# Patient Record
Sex: Male | Born: 1935 | Race: White | Hispanic: No | Marital: Married | Smoking: Former smoker
Health system: Southern US, Community
[De-identification: ages and names within clinical notes are randomized; demographics above are authoritative.]

## PROBLEM LIST (undated history)

## (undated) DIAGNOSIS — K279 Peptic ulcer, site unspecified, unspecified as acute or chronic, without hemorrhage or perforation: Secondary | ICD-10-CM

## (undated) DIAGNOSIS — R0602 Shortness of breath: Secondary | ICD-10-CM

## (undated) DIAGNOSIS — M545 Low back pain, unspecified: Secondary | ICD-10-CM

## (undated) DIAGNOSIS — I10 Essential (primary) hypertension: Secondary | ICD-10-CM

## (undated) DIAGNOSIS — E785 Hyperlipidemia, unspecified: Secondary | ICD-10-CM

## (undated) DIAGNOSIS — N400 Enlarged prostate without lower urinary tract symptoms: Secondary | ICD-10-CM

## (undated) DIAGNOSIS — K922 Gastrointestinal hemorrhage, unspecified: Secondary | ICD-10-CM

## (undated) DIAGNOSIS — E039 Hypothyroidism, unspecified: Secondary | ICD-10-CM

## (undated) DIAGNOSIS — Z9289 Personal history of other medical treatment: Secondary | ICD-10-CM

## (undated) DIAGNOSIS — R0989 Other specified symptoms and signs involving the circulatory and respiratory systems: Secondary | ICD-10-CM

## (undated) DIAGNOSIS — M5136 Other intervertebral disc degeneration, lumbar region: Secondary | ICD-10-CM

## (undated) DIAGNOSIS — I639 Cerebral infarction, unspecified: Secondary | ICD-10-CM

## (undated) DIAGNOSIS — K219 Gastro-esophageal reflux disease without esophagitis: Secondary | ICD-10-CM

## (undated) DIAGNOSIS — G8929 Other chronic pain: Secondary | ICD-10-CM

## (undated) DIAGNOSIS — M51369 Other intervertebral disc degeneration, lumbar region without mention of lumbar back pain or lower extremity pain: Secondary | ICD-10-CM

## (undated) DIAGNOSIS — I2510 Atherosclerotic heart disease of native coronary artery without angina pectoris: Secondary | ICD-10-CM

## (undated) DIAGNOSIS — M1990 Unspecified osteoarthritis, unspecified site: Secondary | ICD-10-CM

## (undated) DIAGNOSIS — Z8719 Personal history of other diseases of the digestive system: Secondary | ICD-10-CM

## (undated) DIAGNOSIS — N184 Chronic kidney disease, stage 4 (severe): Secondary | ICD-10-CM

## (undated) DIAGNOSIS — I509 Heart failure, unspecified: Secondary | ICD-10-CM

## (undated) DIAGNOSIS — M109 Gout, unspecified: Secondary | ICD-10-CM

## (undated) DIAGNOSIS — G4700 Insomnia, unspecified: Secondary | ICD-10-CM

## (undated) HISTORY — DX: Gastro-esophageal reflux disease without esophagitis: K21.9

## (undated) HISTORY — PX: VAGOTOMY AND PYLOROPLASTY: SHX831

## (undated) HISTORY — DX: Hyperlipidemia, unspecified: E78.5

## (undated) HISTORY — DX: Peptic ulcer, site unspecified, unspecified as acute or chronic, without hemorrhage or perforation: K27.9

## (undated) HISTORY — DX: Other specified symptoms and signs involving the circulatory and respiratory systems: R09.89

## (undated) HISTORY — DX: Unspecified osteoarthritis, unspecified site: M19.90

## (undated) HISTORY — DX: Insomnia, unspecified: G47.00

## (undated) HISTORY — PX: OTHER SURGICAL HISTORY: SHX169

## (undated) HISTORY — PX: TOE SURGERY: SHX1073

## (undated) HISTORY — DX: Other intervertebral disc degeneration, lumbar region: M51.36

## (undated) HISTORY — DX: Benign prostatic hyperplasia without lower urinary tract symptoms: N40.0

## (undated) HISTORY — DX: Other intervertebral disc degeneration, lumbar region without mention of lumbar back pain or lower extremity pain: M51.369

## (undated) HISTORY — PX: CATARACT EXTRACTION W/ INTRAOCULAR LENS IMPLANT: SHX1309

## (undated) HISTORY — PX: DUPUYTREN CONTRACTURE RELEASE: SHX1478

## (undated) HISTORY — DX: Atherosclerotic heart disease of native coronary artery without angina pectoris: I25.10

## (undated) HISTORY — DX: Essential (primary) hypertension: I10

## (undated) HISTORY — PX: CORONARY ANGIOPLASTY WITH STENT PLACEMENT: SHX49

## (undated) HISTORY — DX: Heart failure, unspecified: I50.9

---

## 1998-07-24 ENCOUNTER — Ambulatory Visit (HOSPITAL_COMMUNITY): Admission: RE | Admit: 1998-07-24 | Discharge: 1998-07-25 | Payer: Self-pay | Admitting: Interventional Cardiology

## 1999-05-21 HISTORY — PX: BROW LIFT: SHX178

## 1999-10-12 ENCOUNTER — Encounter: Admission: RE | Admit: 1999-10-12 | Discharge: 1999-10-12 | Payer: Self-pay | Admitting: Orthopedic Surgery

## 1999-10-13 ENCOUNTER — Encounter (INDEPENDENT_AMBULATORY_CARE_PROVIDER_SITE_OTHER): Payer: Self-pay | Admitting: *Deleted

## 1999-10-13 ENCOUNTER — Ambulatory Visit (HOSPITAL_BASED_OUTPATIENT_CLINIC_OR_DEPARTMENT_OTHER): Admission: RE | Admit: 1999-10-13 | Discharge: 1999-10-13 | Payer: Self-pay | Admitting: Orthopedic Surgery

## 1999-10-14 ENCOUNTER — Encounter: Payer: Self-pay | Admitting: Internal Medicine

## 1999-10-14 ENCOUNTER — Encounter: Admission: RE | Admit: 1999-10-14 | Discharge: 1999-10-14 | Payer: Self-pay | Admitting: Internal Medicine

## 2000-04-17 ENCOUNTER — Ambulatory Visit (HOSPITAL_COMMUNITY): Admission: RE | Admit: 2000-04-17 | Discharge: 2000-04-17 | Payer: Self-pay | Admitting: Interventional Cardiology

## 2003-05-05 ENCOUNTER — Encounter: Admission: RE | Admit: 2003-05-05 | Discharge: 2003-05-05 | Payer: Self-pay | Admitting: Internal Medicine

## 2003-05-05 ENCOUNTER — Encounter: Payer: Self-pay | Admitting: Internal Medicine

## 2003-05-22 ENCOUNTER — Ambulatory Visit (HOSPITAL_COMMUNITY): Admission: RE | Admit: 2003-05-22 | Discharge: 2003-05-22 | Payer: Self-pay | Admitting: Gastroenterology

## 2004-02-10 ENCOUNTER — Inpatient Hospital Stay (HOSPITAL_COMMUNITY): Admission: AD | Admit: 2004-02-10 | Discharge: 2004-02-12 | Payer: Self-pay | Admitting: Internal Medicine

## 2004-02-11 ENCOUNTER — Encounter (INDEPENDENT_AMBULATORY_CARE_PROVIDER_SITE_OTHER): Payer: Self-pay | Admitting: *Deleted

## 2004-05-19 ENCOUNTER — Ambulatory Visit (HOSPITAL_COMMUNITY): Admission: RE | Admit: 2004-05-19 | Discharge: 2004-05-19 | Payer: Self-pay | Admitting: Interventional Cardiology

## 2004-09-17 ENCOUNTER — Ambulatory Visit (HOSPITAL_COMMUNITY): Admission: RE | Admit: 2004-09-17 | Discharge: 2004-09-17 | Payer: Self-pay | Admitting: Internal Medicine

## 2004-09-27 ENCOUNTER — Encounter: Admission: RE | Admit: 2004-09-27 | Discharge: 2004-09-27 | Payer: Self-pay | Admitting: Internal Medicine

## 2004-10-05 ENCOUNTER — Ambulatory Visit (HOSPITAL_COMMUNITY): Admission: RE | Admit: 2004-10-05 | Discharge: 2004-10-06 | Payer: Self-pay | Admitting: Neurological Surgery

## 2005-09-14 ENCOUNTER — Encounter: Admission: RE | Admit: 2005-09-14 | Discharge: 2005-09-14 | Payer: Self-pay | Admitting: Internal Medicine

## 2006-04-07 ENCOUNTER — Ambulatory Visit (HOSPITAL_COMMUNITY): Admission: RE | Admit: 2006-04-07 | Discharge: 2006-04-07 | Payer: Self-pay | Admitting: Podiatry

## 2006-04-07 ENCOUNTER — Encounter: Payer: Self-pay | Admitting: Vascular Surgery

## 2007-02-21 ENCOUNTER — Ambulatory Visit: Payer: Self-pay | Admitting: Vascular Surgery

## 2007-02-21 ENCOUNTER — Ambulatory Visit: Admission: RE | Admit: 2007-02-21 | Discharge: 2007-02-21 | Payer: Self-pay | Admitting: Internal Medicine

## 2007-04-10 ENCOUNTER — Encounter: Admission: RE | Admit: 2007-04-10 | Discharge: 2007-04-10 | Payer: Self-pay | Admitting: Orthopedic Surgery

## 2007-04-11 ENCOUNTER — Ambulatory Visit (HOSPITAL_BASED_OUTPATIENT_CLINIC_OR_DEPARTMENT_OTHER): Admission: RE | Admit: 2007-04-11 | Discharge: 2007-04-11 | Payer: Self-pay | Admitting: Orthopedic Surgery

## 2007-09-20 HISTORY — PX: OTHER SURGICAL HISTORY: SHX169

## 2008-02-20 ENCOUNTER — Encounter: Admission: RE | Admit: 2008-02-20 | Discharge: 2008-02-20 | Payer: Self-pay | Admitting: Orthopedic Surgery

## 2008-03-24 ENCOUNTER — Inpatient Hospital Stay (HOSPITAL_COMMUNITY): Admission: EM | Admit: 2008-03-24 | Discharge: 2008-03-26 | Payer: Self-pay | Admitting: Emergency Medicine

## 2008-03-30 ENCOUNTER — Emergency Department (HOSPITAL_COMMUNITY): Admission: EM | Admit: 2008-03-30 | Discharge: 2008-03-30 | Payer: Self-pay | Admitting: Emergency Medicine

## 2008-05-23 ENCOUNTER — Inpatient Hospital Stay (HOSPITAL_COMMUNITY): Admission: AD | Admit: 2008-05-23 | Discharge: 2008-05-26 | Payer: Self-pay | Admitting: Internal Medicine

## 2008-09-19 HISTORY — PX: OTHER SURGICAL HISTORY: SHX169

## 2009-03-15 ENCOUNTER — Emergency Department (HOSPITAL_BASED_OUTPATIENT_CLINIC_OR_DEPARTMENT_OTHER): Admission: EM | Admit: 2009-03-15 | Discharge: 2009-03-15 | Payer: Self-pay | Admitting: Emergency Medicine

## 2009-04-23 ENCOUNTER — Emergency Department (HOSPITAL_BASED_OUTPATIENT_CLINIC_OR_DEPARTMENT_OTHER): Admission: EM | Admit: 2009-04-23 | Discharge: 2009-04-23 | Payer: Self-pay | Admitting: Emergency Medicine

## 2009-04-23 ENCOUNTER — Ambulatory Visit: Payer: Self-pay | Admitting: Radiology

## 2009-06-05 ENCOUNTER — Emergency Department (HOSPITAL_BASED_OUTPATIENT_CLINIC_OR_DEPARTMENT_OTHER): Admission: EM | Admit: 2009-06-05 | Discharge: 2009-06-05 | Payer: Self-pay | Admitting: Emergency Medicine

## 2009-06-05 ENCOUNTER — Ambulatory Visit: Payer: Self-pay | Admitting: Diagnostic Radiology

## 2009-11-22 ENCOUNTER — Encounter: Admission: RE | Admit: 2009-11-22 | Discharge: 2009-11-22 | Payer: Self-pay | Admitting: Internal Medicine

## 2010-10-10 ENCOUNTER — Encounter: Payer: Self-pay | Admitting: Internal Medicine

## 2010-10-11 ENCOUNTER — Encounter: Payer: Self-pay | Admitting: Internal Medicine

## 2010-12-24 LAB — URINALYSIS, ROUTINE W REFLEX MICROSCOPIC
Bilirubin Urine: NEGATIVE
Glucose, UA: NEGATIVE mg/dL
Hgb urine dipstick: NEGATIVE
Ketones, ur: NEGATIVE mg/dL
Nitrite: NEGATIVE
Protein, ur: NEGATIVE mg/dL
Specific Gravity, Urine: 1.011 (ref 1.005–1.030)
Urobilinogen, UA: 0.2 mg/dL (ref 0.0–1.0)
pH: 5.5 (ref 5.0–8.0)
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
## 2011-02-01 NOTE — Discharge Summary (Signed)
NAMEDOMENIC, SCHOENBERGER NO.:  [id number]   MEDICAL RECORD NO.:  [id number]          PATIENT TYPE:  INP   LOCATION:  3738                         FACILITY:  [facility]   PHYSICIAN:  Lyn Records, M.D.   DATE OF BIRTH:  [birth year]   DATE OF ADMISSION:  03/24/2008  DATE OF DISCHARGE:  03/26/2008                               DISCHARGE SUMMARY   DISCHARGE DIAGNOSES:  1. Coronary artery disease, medical management.  2. Renal insufficiency.  3. Hypertension.  4. Peptic ulcer disease.  5. History of cerebrovascular accident.  6. Lumbar disk disease status post surgery.  7. Allergy to penicillin.  8. Long-term medication use.  9. Hyperlipidemia.   HOSPITAL COURSE:  [name] is an 77 year old male patient who was  admitted to [facility] on March 24, 2008, with chest pain on exertion  for approximately 48 hours prior to admission.  Lab studies showed that  he ruled out by cardiac isoenzymes.  Other labs showed BUN of 27,  creatinine of 1.8, hemoglobin 11.5, hematocrit 33.4, white count 6.2,  and platelets 118.   Ultimately, he required cardiac catheterization and this showed severe  stenosis of the first septal perforator.  This was felt to be the likely  source of angina.  The LAD, first diagonal, and circumflex lesions are  unchanged compared to 2005.  LV was normal.  Dr. Katrinka Blazing felt that medical  therapy would be appropriate measure in this case, and we increased his  Imdur and added Plavix.   DISCHARGE MEDICATIONS:  1. Imdur 60 mg 1-1/2 tablets daily.  2. Plavix 75 mg a day for 1 month.  3. Crestor 10 mg a day.  4. Atenolol 50 mg twice a day.  5. Nexium 40 mg a day, when needed.  6. Enteric-coated aspirin 325 mg a day.  7. He is on a prednisone taper, which he is to finish.   He is to remain on a low-sodium, heart-healthy diet.  Increase activity  slowly.  Clean cath site gently with soap and water.  No scrubbing.  Follow with Dr. Katrinka Blazing on April 02, 2008,  at 3:45 p.m.      Guy Franco, P.A.      Lyn Records, M.D.  Electronically Signed    LB/MEDQ  D:  03/26/2008  T:  03/26/2008  Job:  [id number]   cc:   Candyce Churn, M.D.  Lyn Records, M.D.

## 2011-02-01 NOTE — Op Note (Signed)
[name], [name]               ACCOUNT NO.:  [id number]   MEDICAL RECORD NO.:  [id number]          PATIENT TYPE:  INP   LOCATION:  2103                         FACILITY:  [facility]   PHYSICIAN:  Shirley Friar, MDDATE OF BIRTH:  1923/02/07   DATE OF PROCEDURE:  DATE OF DISCHARGE:                               OPERATIVE REPORT   INDICATIONS:  1. Melena.  2. History of peptic ulcer disease.   MEDICATIONS:  1. Fentanyl 50 mcg IV.  2. Versed 7 mg IV.   FINDINGS:  Endoscope was inserted through oropharynx and esophagus was  intubated, which was normal in its entirety.  Endoscope was passed down  to the stomach, where a large amount of bright red blood clots were seen  in the fundus of the stomach.  There was a long segment of fresh blood  extruding from the distal part of the stomach.  Upon further irrigation,  it was evident that there was a complicated ulcer in the distal stomach.  Immediately prior to entering into the duodenum, there was a visible  vessel and adherent clot over this in the midportion of this ulcer, but  there was active bright red blood extruding from this area of the ulcer.  Epinephrine 2 mL were injected into the base of the ulcer with good  blanching, but continued oozing of blood.  BICAP Gold probe cautery was  then used to achieve hemostasis, which was unsuccessful.  Decision was  made then to try a hemoclip to attach to the site of bleeding and 1  hemoclip was applied with significant hemostasis with only a small  circle of blood still oozing.  After repeated irrigation of this area, a  decision was made to terminate the procedure due to some hypoxia in the  high 80s during the procedure.  The bleeding was stopped with the  hemoclip and epinephrine injection.   ASSESSMENT:  1. Complicated distal stomach ulcer with visible vessel and active      bleeding.  2. Status post epinephrine injection and hemoclip placement.   PLAN:  1. Transfer to ICU  bed.  2. Continuous IV Protonix infusion.  3. Due to the patient's renal insufficiency, embolization is not an      option and if the patient's bleeding recurs, then he will need      surgery.  4. n.p.o.  5. Aggressive volume resuscitation with blood products and IV fluids.      Shirley Friar, MD  Electronically Signed    VCS/MEDQ  D:  05/23/2008  T:  05/24/2008  Job:  [id number]
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
## 2011-02-01 NOTE — Op Note (Signed)
[name], [name]               ACCOUNT NO.:  [id number]   MEDICAL RECORD NO.:  [id number]          PATIENT TYPE:  AMB   LOCATION:  DSC                          FACILITY:  [facility]   PHYSICIAN:  Leonides Grills, M.D.     DATE OF BIRTH:  [birth year]   DATE OF PROCEDURE:  DATE OF DISCHARGE:                               OPERATIVE REPORT   PREOPERATIVE DIAGNOSIS:  Left hallux rigidus.   POSTOPERATIVE DIAGNOSIS:  Left hallux rigidus.   OPERATION:  Left great toe cheilectomy.   ANESTHESIA:  General.   SURGEON:  Leonides Grills, M.D.   ASSISTANT:  Evlyn Kanner, P.A.   ESTIMATED BLOOD LOSS:  Minimal.   TOURNIQUET TIME:  Approximately half hour.   COMPLICATIONS:  None.   DISPOSITION:  Stable to PR.   INDICATIONS:  This 77 year old male who has had longstanding dorsal left  great toe pain that is interfering with his life point that he can not  do what he wants to do second to arthritis.  He also has a known history  of gouty arthropathy as well.  He was consented to the above procedure.  All risks including infection, nerve vessel injury, persistent pain,  worse pain, prolonged recovery, stiffness, arthritis, possibility of  fusion versus arthroplasty future all explained.  Questions were  encouraged answered.  The patient brought to the operating placed in  supine position after adequate general endotracheal tube anesthesia  administered as well as Ancef 1 gram IV piggyback, left lower extremity  is prepped and draped in sterile manner over proximally placed thigh  tourniquet.  Limb was gravity exsanguinated.  Tourniquet elevated to 290  mmHg.  A longitudinal incision just medial to the extensor hallucis  longus was then made.  Dissection was carried down through skin.  Hemostasis was obtained.  Extensor hallucis longus was protected in its  sheath was not violated.  A longitudinal capsulotomy was then made.  Soft tissue was elevated and retracted both medial laterally.  Bur was  then removed with a sagittal saw followed by a rongeur.  Area was  copiously irrigated normal saline.  Bone wax applied to all exposed bony  surfaces capsule was closed with 3-0  Vicryl stitch.  Subcu was closed 3-0 Vicryl stitch.  Skin was closed 4-0  nylon stitch.  Sterile dressing was applied.  Hard sole shoe was  applied.  The patient was stable to PR.  Range of motion of toe after  the cheilectomy was excellent and much improved.      Leonides Grills, M.D.  Electronically Signed     PB/MEDQ  D:  04/11/2007  T:  04/12/2007  Job:  [id number]
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
## 2011-02-04 NOTE — Op Note (Signed)
[name], [name]               ACCOUNT NO.:  [id number]   MEDICAL RECORD NO.:  [id number]          PATIENT TYPE:  OIB   LOCATION:  NA                           FACILITY:  [facility]   PHYSICIAN:  Tia Alert, MD     DATE OF BIRTH:  November 13, 1922   DATE OF PROCEDURE:  10/05/2004  DATE OF DISCHARGE:                                 OPERATIVE REPORT   PREOPERATIVE DIAGNOSIS:  Lumbar spondylosis with lateral recess stenosis L4-  5 on the left and lumbar spinal stenosis with disk herniation L3-4 on the  left.   PROCEDURES:  1.  Decompressive hemilaminectomy, medial facetectomy and foraminotomy L4-5      on the left for lateral recess stenosis.  2.  Lumbar hemilaminectomy, medial facetectomy and foraminotomy L3-4 on the      left with diskectomy L3-4 on the left for lateral recess stenosis and      lumbar disk herniation.  3.  Microdissection.   SURGEON:  Tia Alert, M.D.   ASSISTANT:  Reinaldo Meeker, M.D.   ANESTHESIA:  General endotracheal anesthesia.   COMPLICATIONS:  None apparent.   INDICATIONS FOR PROCEDURE:  Mr. Rueb is a 77 year old white male who  presented to the neurosurgical unit with complaints of severe left leg pain  in both an L4 and an L5 distribution.  He felt he had progressive weakness  in his lower extremity and got to the point that he had difficulty with  ambulation.  MRI showed a very large disk herniation at L3-4 on the left  causing severe spinal stenosis at this level and at L4-5.  He had  significant lateral recess stenosis from overgrown yellow ligament, facet  hypertrophy, and disk bulge to the left.  Recommended a lumbar decompressive  laminectomy at both levels with diskectomy at L3-4.  He understood and the  risks and benefits and expected outcome and wished to proceed.   DESCRIPTION OF PROCEDURE:  The patient was taken to the operating room and  after induction of adequate general endotracheal anesthesia, he was rolled  into the prone  position with a Wilson frame and all pressure points were  padded.  His lumbar region was prepped with DuraPrep and then draped in the  usual sterile fashion.  There were 8 mL of local anesthesia injected.  A  dorsal midline incision was made and carried down to the lumbosacral fascia.  The fascia was opened and the paraspinous musculature was taken down in a  subperiosteal fashion to expose the L3-4 and L4-5 interspaces.  Intraoperative x-ray confirmed our level and then use of Kerrison punches  and a high-speed air-powered Black Max drill was used to perform a  hemilaminectomy, medial facetectomy and foraminotomy at L4-5 on the left  side.  The underlying yellow ligament was removed, exposing the underlying  dura and L5 nerve root.  Once the decompression was complete, I inspected  the disk space and found a calcified disk bulge.  This seemed to be  noncompressive.  Therefore I lined this with Gelfoam and went to the L3-4  level and again  utilizing the drill and the Kerrison punches, performed a  hemilaminectomy, medial facetectomy and foraminotomy at L3-4 on the left  side.  The underlying yellow ligament was opened and removed, exposing the  underlying dura and L4 nerve root.  Once I returned to the nerve root  medially, there was a large free fragment in the foramen at L3-4 on the  right side adjacent to the pedicle.  This was removed and nerve hooks and  operating microscope was brought into the field and the remainder of the  procedure was done under the operating microscope utilizing microscopic  dissection to perform a thorough intradiskal diskectomy at L3-4 on the left  side.  Once the diskectomy was complete, we palpated with nerve hooks and  coronary dilators to make sure there were no more free fragments or  compressive lesions.  We then irrigated with copious amounts of Bacitracin  containing saline solution, inspected the nerve roots once again and then  closed the fascia  with interrupted #1 Vicryl, closed the subcutaneous and  subcuticular tissue with 2-0 and 3-0 Vicryl and closed the skin with  Dermabond.  The drapes were removed.  The patient was awakened from general  anesthesia and transported to the recovery room in stable condition.  At the  end of the procedure, all sponge, needle and instrument counts were correct.      Markus.Osmond   DSJ/MEDQ  D:  10/05/2004  T:  10/05/2004  Job:  [id number]

## 2011-02-04 NOTE — Cardiovascular Report (Signed)
[facility]  Patient:    [name]                       MRN: [id number] Proc. Date: 04/17/00 Adm. Date:  [id number] Disc. Date: [id number] Attending:  Lyn Records. Iii CC:         Pearla Dubonnet, M.D.                        Cardiac Catheterization  PROCEDURES PERFORMED: 1. Left heart catheterization. 2. Selective coronary angiography. 3. Left ventriculography. 4. Perclose arteriotomy closure.  INDICATIONS FOR PROCEDURE:  Recurrent angina in this 77 year old patient with a history of coronary artery disease, status post prior right coronary stenting in January 1999 and November 1999.  DESCRIPTION OF PROCEDURE:  After an informed consent, the patient was brought to the catheterization lab and a sheath was placed in the right femoral artery using the modified Seldinger technique.  A 6-French A2 multipurpose catheter was used for hemodynamic recordings, left ventriculography, and selective right and left coronary angiography.   The patient tolerated the procedure without complications.  We performed Perclose arteriotomy closure after a right femoral sheathogram demonstrated normal perfusion and anatomical substrate for this procedure.  No complications occurred.  RESULTS:  HEMODYNAMIC DATA: 1. Aortic pressure:  139/61. 2. Left ventricular pressure:  136/17 mmHg.  LEFT VENTRICULOGRAPHY:  The left ventricle is normal in size and demonstrates normal contractility.  The ejection fraction is 65%.  No mitral regurgitation is noted.  SELECTIVE CORONARY ARTERIOGRAPHY: 1. LEFT MAIN:  Widely patent.  No significant obstructive lesions are noted. 2. LEFT ANTERIOR DESCENDING CORONARY:  Diffusely calcified.  There is    eccentric narrowing in the mid LAD beyond the second diagonal.  Obstruction    in this region is 35-45%.  No high-grade obstruction is noted.  The second    diagonal contains a segmental 75% stenosis.  This is a large branch.   The    first diagonal is free of any significant obstruction.  The LAD wraps    around the left ventricular apex. 3. CIRCUMFLEX ARTERY:  Relatively small vessel, giving origin to two obtuse    marginal branches.  There is 50% narrowing prior to bifurcation into these    two branches.  This occurs in the mid vessel.  No high-grade obstruction is    felt to be present. 4. RIGHT CORONARY ARTERY:  A large vessel that has a shepherds crook, and has    four implanted stents starting proximally and extending to the mid vessel.    This vessel is widely patent.  No evidence of significant obstruction is    noted within the right coronary artery.  CONCLUSIONS: 1. The patient has moderate coronary artery disease, with a 75% stenosis in    the first diagonal branch.  There is also disease in the mid LAD in the    region where the second diagonal branch arises from it.  The lesion in the    LAD is eccentric, but in almost every view appears to be less than 50%    narrowed.  The circumflex artery contains a 30-40% stenosis prior to    bifurcation in the two large obtuse marginal branches. 2. Wide patency of the previously stented right coronary artery. 3. Normal left ventricular function.  PLAN:  Medical therapy.  At this time the assumption is that the patients angina is due to the  diagonal.  Risk factor modification and medical therapy, including beta blocker therapy and nitrates should be considered.  If the patient develops refractory symptoms related to this anatomical substrate, we may consider doing cutting balloon angioplasty on the diagonal; or, potentially even rotational atherectomy on the diagonal in attempt to achieve better patency. DD:  04/17/00 TD:  04/17/00 Job: [id number]

## 2011-02-04 NOTE — Discharge Summary (Signed)
[name], SCHOENFELD NO.:  [id number]   MEDICAL RECORD NO.:  [id number]                   PATIENT TYPE:  INP   LOCATION:  3030                                 FACILITY:  [facility]   PHYSICIAN:  Candyce Churn, M.D.          DATE OF BIRTH:  1923/07/21   DATE OF ADMISSION:  02/10/2004  DATE OF DISCHARGE:  02/12/2004                                 DISCHARGE SUMMARY   DISCHARGE DIAGNOSES:  1. Acute right cerebellar infarct with apparent occlusion of the right     posterior inferior cerebellar artery.  2. History of coronary artery disease, stent placement in the past.  3. Hyperlipidemia.  4. Elevated homocysteine levels.  5. Mild hyperglycemia with normal hemoglobin A1c.  6. Hypertension, controlled.  7. Lumbar degenerative joint disease.  8. Mild renal insufficiency with creatinine 1.8.  9. History of prostatosis and prostatitis.  10.      History of esophageal stricture with Schatzki's ring.  11.      History of hypercholesterolemia.   DISCHARGE MEDICATIONS:  1. Plavix 75 mg daily.  2. Aspirin 81 mg daily.  3. Lipitor 10 mg daily.  4. Prilosec 20 mg daily.  5. Atenolol 25 mg daily.  6. Folic acid 1 g daily.  7. Multivitamin with B complex.   PROCEDURE:  MRI/MRA of the brain:  Revealed acute right cerebellar infarct  with probable occlusion of the right posterior/inferior cerebellar artery.  There are a few old small vessel strokes in the left hemisphere. No large  vessel stroke is seen supratentorially. The infarcted area of the inferior  cerebellum is swollen, and there is some mild mass effect on the brainstem  and fourth ventricle, but there was no obstructive hydrocephalus.   CONSULTATIONS:  Neurology. Dr. Porfirio Mylar Dohmeier.   HOSPITAL COURSE:  [name] is a very pleasant 77 year old male who  presented on Feb 09, 2004 to [facility] with vertigo. He  also had some associated nausea. No obvious focal  neurologic deficits were  noted at the time.   The patient was begun on Plavix, and MRI/MRA was ordered. His condition did  not improve over the ensuring 12 hours, and he was admitted to [facility] on Feb 10, 2004. MRI/MRA revealed the above, and Plavix was  continued as well as physical therapy and occupational therapy was  instituted.   The patient did obtain carotid Dopplers while hospitalized, and these showed  no significant stenosis, just some internal carotid artery plaque  bilaterally. Two-D echocardiogram was performed on Feb 11, 2004, and this  revealed ejection fraction of 55 to 65%, and there were no left ventricular  wall motion abnormalities. There was no diagnostic echocardiographic  evidence of cardiac source of embolism. Read by Dr. Candace Cruise.   The patient initially was extremely ataxia on admission, but within 48  hours, his ataxia was minimal. He was able to ambulate about his  room  without assistance but still slightly unsteady on his feet.   The patient was wishing to return home and will be discharged home with home  nursing visit and PT/OT. He should use a cane to walk for now.   I should mention that he did have elevated homocysteine level and will need  folic acid therapy at home.   At the time of discharge, his abdominal pain and nausea had markedly  improved and will go with a no concentrated sweets, low fat diet, that is  bland for the first several days. Will also allow him to have Phenergan 12.5  mg orally every 6 to 8 hours as needed for nausea.   Renal insufficiency was noted prior to admission with creatinine of 2.3 and  has improved. Creatinines while hospitalized were 1.1 to 1.8 range.   The patient was discharged in improved condition, and I will him up in the  office in 10 days.                                                Candyce Churn, M.D.    RNG/MEDQ  D:  02/12/2004  T:  02/13/2004  Job:  [id number]

## 2011-02-04 NOTE — Cardiovascular Report (Signed)
NAMEGURNIE, [name] NO.:  [id number]   MEDICAL RECORD NO.:  [id number]                   PATIENT TYPE:  OIB   LOCATION:  2899                                 FACILITY:  [facility]   PHYSICIAN:  Lesleigh Noe, M.D.            DATE OF BIRTH:  [birth year]   DATE OF PROCEDURE:  05/19/2004  DATE OF DISCHARGE:  05/19/2004                              CARDIAC CATHETERIZATION   REASON FOR CATHETERIZATION:  History of coronary artery disease, status post  right coronary stents, recent increased angina.   PROCEDURES PERFORMED:  1.  Left heart catheterization.  2.  Selective coronary angiography.  3.  Left ventriculography.  4.  Angiocele arteriotomy closure.   SURGEON:   DESCRIPTION OF PROCEDURE:  After informed consent, a 6 French sheath was  placed using a modified Seldinger technique.  A 6 French A2 multipurpose  catheter was used for hemodynamic recordings and left ventriculography.  We  then performed left and right coronary angiography using a #4 left and right  Judkins catheter, respectively.  A right sheathogram was performed post  procedure and arteriotomy closure with angiocele performed with good  hemostasis.   RESULTS:  1.  Hemodynamic Data:      1.  Aortic pressure 163/63.      2.  Left ventricular pressure 163/18./   1.  Left ventriculography:  LV cavity size and function are normal.  EF is      60%.   1.  Coronary angiography:      1.  Left main coronary artery:  Calcified but widely patent.      2.  Left anterior descending coronary artery:  LAD contained 60 to 70%          proximal narrowing.  Septal perforator #1 is 90 to 95% obstructed.          Diagonal #2, which was a large vessel, was 95% obstructed.  The LAD          in this region between the first septal perforator and the second          diagonal is 60 to 70% and calcified.  LAD reaches left ventricular          apex.      3.  Circumflex coronary artery:  Circumflex gives  origin to a large          first obtuse marginal and there is proximal/mid 70% narrowing.      4.  Right coronary artery:  The right coronary artery is a large vessel          given PDA and several large left ventricular branches.  Previously          placed proximal and mid vessel stents were widely patent.  No          significant obstruction is noted in the RCA.   CONCLUSION:  1.  Widely patent  right coronary with no evidence of in-stent restenosis in      the right coronary artery.  There is moderate proximal/mid left anterior      descending disease and high grade obstruction in the second diagonal and      the first septal perforator.  These are the likely sources of the      patient's anginal symptoms.  There is moderate disease in the mid      circumflex.  The circumflex appears unchanged from the prior study.  2.  Normal left ventricular function.   PLAN:  Medical therapy.  IV hydration for five hours this evening.  BUN and  creatinine will be obtained in 48 hours in our office.  When unable to  control the patient's symptoms with medication, we may need to consider  coronary artery bypass grafting. Percutaneous intervention on the septal  perforator and diagonal did not seem to be good treatment options.  The  diagonal could be treated with rotational atherectomy until the perforator  could not be treated by any technique.                                               Lesleigh Noe, M.D.    HWS/MEDQ  D:  05/19/2004  T:  05/20/2004  Job:  [id number]   cc:   Theressa Millard, M.D.  [address] [place]  [place] [address]  Fax: [phone]   Candyce Churn, M.D.  [address] [place]  [place] [address]  Fax: [phone]

## 2011-02-04 NOTE — Consult Note (Signed)
[name], [name]                         ACCOUNT NO.:  [id number]   MEDICAL RECORD NO.:  [id number]                   PATIENT TYPE:  INP   LOCATION:  3030                                 FACILITY:  [facility]   PHYSICIAN:  Melvyn Novas, M.D.               DATE OF BIRTH:  [birth year]   DATE OF CONSULTATION:  02/10/2004  DATE OF DISCHARGE:                                   CONSULTATION   This gentleman was admitted today on 02/10/2004 through Dr. Kevan Ny offices  arrangements.  The patient is a 77 year old gentleman with a long history of  concomitant internal medicine problems.  These include coronary artery  disease, GERD, peptic ulcer disease, and diverticulosis, degenerative joint  disease especially spinal, nocturnal leg cramps question of restless leg  syndrome, ruptured biceps tendon, renal insufficiency with elevated  creatinine and decreased creatinine clearance, prostatitis, dyslipidemia,  sciatica on the right hip, TMJ.  The patient has coronary artery disease and  had multiple RCA stents placed all in the same artery.  Laparotomy for  peptic ulcer disease in the past and arthroscopic surgery of the right knee  by Dr. ___________.   CURRENT MEDICATIONS:  Atenolol, Pepcid, baby aspirin, naproxen, recently  Plavix was added to his regimen, he believes about three or four months ago.   The patient was admitted today after a 48 hour complaint of ataxia falling  to the right and vertigo spells that the patient describes as very violent.  He had some emesis with this but denies headaches and has no neck stiffness,  no ear pain and no peripheral sensory loss.   PHYSICAL EXAMINATION:  VITAL SIGNS: The patient was referred through the ER  for admission and presents here with a weight of 165 pounds, normal  temperature, a blood pressure of 130/75, pulse rate is regular but slightly  elevated at 88.  HEENT: Mucous membranes appear well perfused.  NECK: No lumps.  No goiter.  EXTREMITIES: Peripheral pulses area palpable.  MENTAL STATUS: The patient is alert and oriented, very pleasant.  He can  participate fluently in the conversations and gave details of his medical  history, medicine regimen and his beekeeping hobby.  CRANIAL NERVES: Left-sided cataract, mild.  Pupils are equal to light and  accommodation.  Extraocular movements are full.  There is end point  established with gaze to the right.  There is no vertical nystagmus noted.  No facial droop.  Tongue and uvula are midline.  Gag is intact.  Range of  motion over the neck is normal.  MOTOR EXAM: Shows equal grip strength, tone and mass of upper extremities.  On passive movement there seems to be a slight increase in tone on the right  initially, but no pronator drift.  On the finger-to-nose test, mild limb  ataxia on the right is noticed.  Further noticed is that the right leg  cannot be elevated as long as  the left against gravity at about 8 seconds  versus 12 but the patient shows only a drift, not a drop in leg strength and  adduction and abduction appear intact.  Again, there is this time a decrease  in tone in the right lower extremity.  Downgoing toes to plantar  stimulation, equal reflexes but are attenuated in the lower extremities.  SENSORY: Unimpaired to pin prick, touch, vibration and temperature.  Coordination, the patient drifts to the right and tends to fall forward.  Interestingly, no tremor is noticed and his speech is unchanged.   MRI: A very slight cerebellar inferior stroke in the right cerebellum,  explaining the patient's vertiginous spells his more truncal ataxia problem  with intact strength.  A diagnosis of ischemic stroke, embolic or thrombotic  in nature, unclear at this point.   PLAN:  Admit the patient and have the stroke service follow him.  He will  have a 2D echocardiogram, carotid Doppler studies, homocystine, sed rate and  fasting lipid profile tomorrow.  He also will  be evaluated for PT/OT and  rehab.  Since being placed on Plavix did not prevent this stroke, I suggest  that we should evaluate him for either Aggrenox or Coumadin if we have  strong evidence of an embolic event.  The patient understands and agrees  with this regimen.   Sincerely,                                               Melvyn Novas, M.D.    CD/MEDQ  D:  02/10/2004  T:  02/12/2004  Job:  [id number]   cc:   Candyce Churn, M.D.  [address] [place]  [place] [address]  Fax: [phone]   Pramod P. Pearlean Brownie, MD  Fax: [phone]

## 2011-02-04 NOTE — Op Note (Signed)
   NAMEZACHAREY, [name]                         ACCOUNT NO.:  [id number]   MEDICAL RECORD NO.:  [id number]                   PATIENT TYPE:  AMB   LOCATION:  ENDO                                 FACILITY:  [facility]   PHYSICIAN:  Danise Edge, M.D.                DATE OF BIRTH:  07-22-1923   DATE OF PROCEDURE:  DATE OF DISCHARGE:                                 OPERATIVE REPORT   PROCEDURE:  Esophagogastroduodenoscopy with Savary esophageal dilation.   INDICATIONS FOR PROCEDURE:  Mr. Gilmar Bua is a 77 year old male born  [birth year].  Mr. Tetrault has a history of peptic ulcer disease and  underwent a vagotomy with pyloroplasty years ago.  He underwent a barium  swallow, and the 13-mm barium tablet hung at the esophagogastric junction.   ENDOSCOPIST:  Danise Edge, M.D.   PREMEDICATION:  Demerol 30 mg, Versed 5 mg.   DESCRIPTION OF PROCEDURE:  After obtaining informed consent, Mr. Hoopes was  placed in the left lateral decubitus position.  I administered intravenous  Demerol and intravenous Versed to achieve conscious sedation for the  procedure.  The patient's blood pressure, oxygen saturation, and cardiac  rhythm were monitored throughout the procedure and documented the medical  record.   The Olympus gastroscope was passed through the posterior hypopharynx and  into the proximal esophagus without difficulty.  The hypopharynx, larynx,  and vocal cords appeared normal.   Esophagoscopy:  The proximal, mid, and lower segments of the esophagus  appeared normal, except for the presence of a shallow Schatski's ring at the  esophagogastric junction.   Gastroscopy:  Retroflex view of the gastric cardia and fundus was normal.  The gastric body, antrum, and pylorus appeared normal.  A biopsy was taken  from the distal gastric antrum for CLOtest to rule out Helicobacter pylori  antral gastritis.   Duodenoscopy:  The pyloroplasty was patent, and the duodenum appeared   normal.   Savary esophageal dilation:  The Savary dilator wire was passed through the  endoscope and the tip of the guidewire advanced to the distal gastric antrum  as confirmed endoscopically.  The 14-mm Savary dilator passed without  resistance.  Repeat esophagogastroscopy revealed an intact esophageal mucosa  without indication of esophageal mucosal dilation and no gastric trauma  secondary to the guidewire.   RECOMMENDATIONS:  CLOtest pending.                                               Danise Edge, M.D.    MJ/MEDQ  D:  05/22/2003  T:  05/23/2003  Job:  [id number]   cc:   Candyce Churn, M.D.  [address] [place]  [place] [address]  Fax: [phone]

## 2011-02-04 NOTE — Op Note (Signed)
[facility]  Patient:    [name]                       MRN: [id number] Proc. Date: 10/13/99 Adm. Date:  [id number] Attending:  Marlowe Shores                           Operative Report  PREOPERATIVE DIAGNOSIS:  Right palm Dupuytrens contracture.  POSTOPERATIVE DIAGNOSIS:  Right palm Dupuytrens contracture.  PROCEDURE:  Excision of above.  SURGEON:  Artist Pais. Mina Marble, M.D.  ANESTHESIA:  Bier block.  TOURNIQUET TIME:  Thirty-eight minutes.  COMPLICATIONS:  None.  DRAINS:  None.  SPECIMENS:  One specimen sent.  DESCRIPTION OF PROCEDURE:  The patient was taken to the operating room and after induction of adequate IV Bier block analgesia, the right upper extremity was prepped and draped in the usual sterile fashion.  Once this was done, a Brunner-type incision was made, starting at the area in the palm, in line with he index finger metacarpal at the base of the thumb.  In a Brunner-type fashion, flaps were raised in both the medial and lateral directions.  The underlying diseased  palmar fascia was identified and released from the area of the base of the thenar muscles.  The ulnar digital nerve to the index finger was identified and retracted carefully.  After this was done, a large cord that went from the thenar eminence muscle area to the mid-shaft of the metacarpal was carefully dissected free from the underlying soft tissues and removed for pathologic confirmation.  At this point in time, the wound was thoroughly irrigated.  The neurovascular bundles were inspected; there was no damage seen.  Hemostasis was achieved with bipolar cautery and the flaps were closed with 4-0 nylon with a combination of simple and horizontal mattress sutures.  A sterile dressing of Xeroform, 4 x 4s, fluffs and a compressive dressing as well as volar splint were applied.  Patient tolerated the procedure well and went to recovery room in  stable fashion.DD:  10/13/99 TD:  10/14/99 Job: [id number]

## 2011-02-04 NOTE — H&P (Signed)
NAMESHAMUS, DESANTIS NO.:  [id number]   MEDICAL RECORD NO.:  [id number]                   PATIENT TYPE:  INP   LOCATION:  3030                                 FACILITY:  [facility]   PHYSICIAN:  Candyce Churn, M.D.          DATE OF BIRTH:  [birth year]   DATE OF ADMISSION:  02/10/2004  DATE OF DISCHARGE:                                HISTORY & PHYSICAL   CHIEF COMPLAINT:  Ataxia.   HISTORY OF PRESENT ILLNESS:  [name] is a very pleasant 77-year-  old male with history of:   1. Vagotomy and pyloroplasty secondary to peptic ulcer disease in the     distant past.  2. Coronary artery disease with four coronary stent placements in the past.  3. Hypercholesterolemia.  4. Hypertension.  5. History of esophageal stricture with Schatzki's ring and gastroesophageal     reflux disease.  6. Lumbar DJD.  7. Mild renal insufficiency with creatinine ranging from 1.8-2.3.  8. Prostatosis with episodic prostatitis.   [name] presents with sudden onset of dizziness and ataxia on the  morning of Feb 09, 2004 while preparing breakfast.  He had severe vertigo  and had also experienced a low-grade right-sided headache for several days.  He experienced double vision as well on the day prior to admission but none  on the day of admission.  He had nausea on the day prior to admission and  nausea and vomiting x4-5 on the day of admission.  Speech has been somewhat  slurred at times.  Now, he presents with some mild abdominal pain with  retching.  He has continued ataxia and severe gait unsteadiness.  Vertigo  has resolved.  A carotid bruit can be heard in the left carotid artery.   MEDICATIONS:  1. Plavix 75 mg a day started yesterday.  2. Aspirin 81 mg a day.  3. Lipitor 10 mg a day.  4. Prilosec 20 mg a day.  5. Atenolol 25 mg daily.   ALLERGIES:  PENICILLIN.   FAMILY HISTORY:  Colon cancer and heart disease.   SOCIAL HISTORY:  The patient has  a distant history of tobacco use many years  ago for about 10 years.  He is retired and he is a Systems developer.   REVIEW OF SYSTEMS:  Denies chest pain, shortness of breath, melena, or  bright red blood per rectum.  Otherwise, as per HPI.  He is very unstable on  his feet.   PHYSICAL EXAMINATION:  GENERAL:  Alert, oriented x3.  Conversive.  Low-grade  headache at best posteriorly.  VITAL SIGNS:  Temperature 98.2, blood pressure 134/66, pulse 58 and regular,  respiratory rate 18 and easy, O2 saturation on room air is 94%.  HEENT:  Atraumatic, normocephalic.  Oropharynx is clear.  NECK:  Supple.  He has a left carotid bruit.  2+ carotid pulses bilaterally.  No JVD, no thyromegaly.  CHEST:  Clear  to auscultation.  CARDIAC:  Regular rhythm without murmurs or gallops.  ABDOMEN:  Soft, mildly tender to palpation in the epigastrium.  Bowel sounds  are positive.  He is nondistended.  No rebound.  No obvious organomegaly.  EXTREMITIES:  Without cyanosis, clubbing, or edema.  NEUROLOGIC:  Oriented x3.  Cranial nerves are intact.  He seems to have a  slight hypotonia on the right.  He has positive Romberg.  Rapid alternating  hand movements are very slow on the right versus the left.   MRI/MRA of the brain reveals an acute right cerebellar infarction and MRA  reveals a probable occlusion of the right posterior/inferior cerebellar  artery.   White cell count is 8500, hemoglobin 13.3, platelet count 177,000.  ESR is  13 mm/h.  Sodium 138, potassium 4.7, chloride 103, bicarb 28, glucose 164,  BUN 24, creatinine 1.8, calcium 9.9, total protein 6.9, albumin 3.7.  SGOT  20, SGPT 16, alkaline phosphatase 87, total bilirubin 0.8.  Prothrombin time  was 13.6 seconds, amylase is 51, lipase is 25, TSH is 1.41.  CPK is 82 and  CK-MB of 2.8, troponin-I of less than 0.01.  Urinalysis is within normal  limits with a specific gravity of 1.022.  Homocystine level is pending.  Lipid profile reveals a cholesterol of  140, triglycerides 107, HDL 37, LDL  82, total cholesterol/HDL ratio of 3.8.  Hemoglobin A1c is 5.9 and  homocystine level is 16.43.   ASSESSMENT:  A 77 year old male with right cerebellar infarction likely  secondary to cerebrovascular disease with occlusion.  A 2-D echocardiogram  has been performed and is pending and doubt this is embolic in nature.  Carotid Dopplers revealed no internal carotid artery stenosis and vertebral  flow is antegrade.  We will obtain neurology consult and occupational  therapy/physical therapy and continue Plavix for now.                                                Candyce Churn, M.D.    RNG/MEDQ  D:  02/12/2004  T:  02/12/2004  Job:  [id number]   cc:   Melvyn Novas, M.D.  [address]  [address]  [place]  [place] [address]  Fax: [phone]

## 2011-06-16 LAB — COMPREHENSIVE METABOLIC PANEL
AST: 17
CO2: 30
Calcium: 9.1
Creatinine, Ser: 1.85 — ABNORMAL HIGH
GFR calc Af Amer: 43 — ABNORMAL LOW
GFR calc non Af Amer: 35 — ABNORMAL LOW
Sodium: 140
Total Protein: 6.2

## 2011-06-16 LAB — POCT I-STAT, CHEM 8
Chloride: 107
Creatinine, Ser: 2.1 — ABNORMAL HIGH
Glucose, Bld: 79
Hemoglobin: 11.6 — ABNORMAL LOW
Potassium: 4.7
Sodium: 141

## 2011-06-16 LAB — CBC
Hemoglobin: 11.6 — ABNORMAL LOW
MCHC: 33.7
MCHC: 34.6
MCV: 93
MCV: 93
MCV: 93.3
MCV: 93.5
Platelets: 118 — ABNORMAL LOW
Platelets: 132 — ABNORMAL LOW
Platelets: 144 — ABNORMAL LOW
RBC: 3.57 — ABNORMAL LOW
RBC: 3.58 — ABNORMAL LOW
RBC: 3.85 — ABNORMAL LOW
RDW: 13.9
RDW: 14.4
WBC: 6.9
WBC: 9

## 2011-06-16 LAB — POCT CARDIAC MARKERS
CKMB, poc: 3.4
CKMB, poc: 4
Myoglobin, poc: 142
Myoglobin, poc: 146
Troponin i, poc: <0.05

## 2011-06-16 LAB — BASIC METABOLIC PANEL
BUN: 27 — ABNORMAL HIGH
CO2: 30
CO2: 32
Calcium: 8.5
Calcium: 8.7
Chloride: 105
Creatinine, Ser: 1.8 — ABNORMAL HIGH
Creatinine, Ser: 1.84 — ABNORMAL HIGH
GFR calc Af Amer: 44 — ABNORMAL LOW
Glucose, Bld: 133 — ABNORMAL HIGH
Sodium: 142

## 2011-06-16 LAB — CK TOTAL AND CKMB (NOT AT ARMC)
CK, MB: 6.6 — ABNORMAL HIGH
Relative Index: 5 — ABNORMAL HIGH
Relative Index: INVALID
Total CK: 133
Total CK: 87

## 2011-06-16 LAB — DIFFERENTIAL
Eosinophils Relative: 0
Lymphocytes Relative: 10 — ABNORMAL LOW
Lymphocytes Relative: 11 — ABNORMAL LOW
Lymphs Abs: 0.8
Lymphs Abs: 0.9
Monocytes Relative: 6
Monocytes Relative: 8
Neutro Abs: 5.5
Neutrophils Relative %: 80 — ABNORMAL HIGH
Neutrophils Relative %: 83 — ABNORMAL HIGH

## 2011-06-16 LAB — TROPONIN I: Troponin I: 0.01

## 2011-06-16 LAB — PROTIME-INR
INR: 1
Prothrombin Time: 13.2

## 2011-06-22 LAB — CBC
HCT: 26.2 — ABNORMAL LOW
Hemoglobin: 6.7 — CL
Hemoglobin: 8.2 — ABNORMAL LOW
MCHC: 33
MCHC: 33.4
MCHC: 34.7
MCV: 94.3
MCV: 95.1
Platelets: 112 — ABNORMAL LOW
Platelets: 117 — ABNORMAL LOW
RBC: 2.05 — ABNORMAL LOW
RBC: 2.59 — ABNORMAL LOW
RBC: 2.79 — ABNORMAL LOW
RBC: 2.98 — ABNORMAL LOW
RDW: 15.2
RDW: 15.5
WBC: 12 — ABNORMAL HIGH
WBC: 7.9
WBC: 9.6

## 2011-06-22 LAB — BASIC METABOLIC PANEL
BUN: 43 — ABNORMAL HIGH
BUN: 51 — ABNORMAL HIGH
BUN: 75 — ABNORMAL HIGH
CO2: 26
Calcium: 8.5
Calcium: 8.6
Calcium: 8.9
Creatinine, Ser: 2.52 — ABNORMAL HIGH
GFR calc Af Amer: 30 — ABNORMAL LOW
GFR calc Af Amer: 40 — ABNORMAL LOW
GFR calc non Af Amer: 25 — ABNORMAL LOW
GFR calc non Af Amer: 30 — ABNORMAL LOW
GFR calc non Af Amer: 33 — ABNORMAL LOW
Glucose, Bld: 220 — ABNORMAL HIGH
Glucose, Bld: 298 — ABNORMAL HIGH
Potassium: 4.5
Potassium: 4.8
Sodium: 139
Sodium: 139

## 2011-06-22 LAB — CROSSMATCH

## 2011-06-22 LAB — GLUCOSE, CAPILLARY
Glucose-Capillary: 119 — ABNORMAL HIGH
Glucose-Capillary: 191 — ABNORMAL HIGH
Glucose-Capillary: 219 — ABNORMAL HIGH

## 2011-06-22 LAB — HEMOGLOBIN AND HEMATOCRIT, BLOOD
HCT: 23.6 — ABNORMAL LOW
HCT: 25 — ABNORMAL LOW
HCT: 27.3 — ABNORMAL LOW
HCT: 35 — ABNORMAL LOW
Hemoglobin: 11.2 — ABNORMAL LOW
Hemoglobin: 8 — ABNORMAL LOW
Hemoglobin: 8.4 — ABNORMAL LOW
Hemoglobin: 8.9 — ABNORMAL LOW
Hemoglobin: 9.2 — ABNORMAL LOW

## 2011-07-04 LAB — BASIC METABOLIC PANEL
Calcium: 9.7
Chloride: 102
Creatinine, Ser: 2.21 — ABNORMAL HIGH
GFR calc Af Amer: 35 — ABNORMAL LOW

## 2011-07-04 LAB — POCT HEMOGLOBIN-HEMACUE
Hemoglobin: 14.3
Operator id: 20013

## 2011-08-03 ENCOUNTER — Other Ambulatory Visit: Payer: Self-pay | Admitting: Interventional Cardiology

## 2011-08-04 ENCOUNTER — Encounter (HOSPITAL_BASED_OUTPATIENT_CLINIC_OR_DEPARTMENT_OTHER): Payer: Self-pay

## 2011-08-04 ENCOUNTER — Other Ambulatory Visit: Payer: Self-pay | Admitting: Interventional Cardiology

## 2011-08-04 ENCOUNTER — Inpatient Hospital Stay (HOSPITAL_BASED_OUTPATIENT_CLINIC_OR_DEPARTMENT_OTHER)
Admission: RE | Admit: 2011-08-04 | Discharge: 2011-08-04 | Disposition: A | Discharge status: Home / Self Care | Payer: Medicare Other | Source: Ambulatory Visit | Attending: Interventional Cardiology | Admitting: Interventional Cardiology

## 2011-08-04 ENCOUNTER — Encounter (HOSPITAL_BASED_OUTPATIENT_CLINIC_OR_DEPARTMENT_OTHER)
Admission: RE | Disposition: A | Discharge status: Home / Self Care | Payer: Self-pay | Source: Ambulatory Visit | Attending: Interventional Cardiology

## 2011-08-04 DIAGNOSIS — I2510 Atherosclerotic heart disease of native coronary artery without angina pectoris: Secondary | ICD-10-CM | POA: Insufficient documentation

## 2011-08-04 DIAGNOSIS — N289 Disorder of kidney and ureter, unspecified: Secondary | ICD-10-CM | POA: Insufficient documentation

## 2011-08-04 SURGERY — JV LEFT HEART CATHETERIZATION WITH CORONARY ANGIOGRAM
Anesthesia: Moderate Sedation

## 2011-08-04 MED ORDER — SODIUM CHLORIDE 0.9 % IV SOLN: INTRAVENOUS | Status: DC

## 2011-08-04 MED ORDER — ACETAMINOPHEN 325 MG PO TABS: 650.0000 mg | ORAL_TABLET | ORAL | Status: DC | PRN

## 2011-08-04 MED ORDER — ASPIRIN 81 MG PO CHEW: 324.0000 mg | CHEWABLE_TABLET | ORAL | Status: DC

## 2011-08-04 MED ORDER — OXYCODONE-ACETAMINOPHEN 5-325 MG PO TABS: 1.0000 | ORAL_TABLET | ORAL | Status: DC | PRN

## 2011-08-04 NOTE — OR Nursing (Signed)
Meal served 

## 2011-08-04 NOTE — OR Nursing (Signed)
Patient received 1300 cc of 0.9 NS IV fluids.  IV discontinued at 1620.  Discharged to home via wheelchair.

## 2011-08-04 NOTE — Op Note (Signed)
PROCEDURE:  Left heart catheterization with selective coronary angiography.  INDICATIONS:    The risks, benefits, and details of the procedure were explained to the patient.  The patient verbalized understanding and wanted to proceed.  Informed written consent was obtained.  PROCEDURE TECHNIQUE:  After Xylocaine anesthesia a 4 French sheath was placed in the right femoral artery with a single anterior needle wall stick.   Coronary angiography was done using a  #4  French left and right Judkins catheters.    CONTRAST:  Total of 35 cc.  COMPLICATIONS:  None.    HEMODYNAMICS:  Aortic pressure was 126/54 mmHg.  ANGIOGRAPHIC DATA:   The left main coronary artery is  normal.  The left anterior descending artery is  heavily calcified with diffuse 80% narrowing in the mid segment..The first and second diagonal branches contain ostial 80% stenoses.  The left circumflex artery is  moderate calcification with 50-70% mid vessel obstruction.  The right coronary artery is  calcified and contains overlapping stents in the proximal to mid segment. There is greater than 80% stenosis in the ostium of the right coronary and the 4 French cath for the damps with engagement.Marland Kitchen  LEFT VENTRICULOGRAM:  not performed  IMPRESSIONS:   1. Heavily calcified coronary arteries with moderate circumflex obstruction, moderate to severe LAD obstruction, and severe ostial RCA obstruction.  2. Left ventriculography was not performed because of renal insufficiency.  3. Based upon this anatomy the ostial right coronary lesion. The the source of symptoms. PCI on the ostium of the right coronary is possible although it may require rotational atherectomy to get a good result.    RECOMMENDATION:    1. Will review the angiograms and determine if PCI is a consideration. Three-vessel disease surgery could also be considered, however at the patient's age a surgical option would not be prudent.  2. We will hydrate the patient  vigorously before discharge. He will come for a renal profile in 48 hours.Marland Kitchen

## 2011-08-04 NOTE — OR Nursing (Signed)
Bedrest started  @ 1310.

## 2011-08-04 NOTE — H&P (Signed)
  Date of Initial H&P: 08/01/2011  History reviewed, patient examined, no change in status, stable for surgery.  Paper chart reviewed and no changes in status has occurred. This data will be scanned into the [facility] chart.  Katrinka Blazing Rosina Lowenstein 08/04/2011

## 2011-08-04 NOTE — OR Nursing (Signed)
Pt ambulated in hall and voided without difficulty, site intact, level 0, dressing clean and dry. Discharge instructions reviewed, pt states understanding. Repositioned, up to a chair for remainder of hydration time.

## 2011-08-04 NOTE — OR Nursing (Signed)
IV rate increased to 257ml/hr per telephone order, Dr Katrinka Blazing

## 2011-08-09 ENCOUNTER — Encounter: Payer: Self-pay | Admitting: Cardiothoracic Surgery

## 2011-08-09 DIAGNOSIS — G4700 Insomnia, unspecified: Secondary | ICD-10-CM | POA: Insufficient documentation

## 2011-08-09 DIAGNOSIS — N184 Chronic kidney disease, stage 4 (severe): Secondary | ICD-10-CM | POA: Insufficient documentation

## 2011-08-09 DIAGNOSIS — M1990 Unspecified osteoarthritis, unspecified site: Secondary | ICD-10-CM | POA: Insufficient documentation

## 2011-08-09 DIAGNOSIS — I639 Cerebral infarction, unspecified: Secondary | ICD-10-CM | POA: Insufficient documentation

## 2011-08-09 DIAGNOSIS — N400 Enlarged prostate without lower urinary tract symptoms: Secondary | ICD-10-CM | POA: Insufficient documentation

## 2011-08-09 DIAGNOSIS — M5136 Other intervertebral disc degeneration, lumbar region: Secondary | ICD-10-CM | POA: Insufficient documentation

## 2011-08-09 DIAGNOSIS — I10 Essential (primary) hypertension: Secondary | ICD-10-CM | POA: Insufficient documentation

## 2011-08-09 DIAGNOSIS — I2510 Atherosclerotic heart disease of native coronary artery without angina pectoris: Secondary | ICD-10-CM | POA: Insufficient documentation

## 2011-08-09 DIAGNOSIS — K279 Peptic ulcer, site unspecified, unspecified as acute or chronic, without hemorrhage or perforation: Secondary | ICD-10-CM | POA: Insufficient documentation

## 2011-08-15 ENCOUNTER — Institutional Professional Consult (permissible substitution) (INDEPENDENT_AMBULATORY_CARE_PROVIDER_SITE_OTHER): Payer: Medicare Other | Admitting: Cardiothoracic Surgery

## 2011-08-15 ENCOUNTER — Encounter: Payer: Self-pay | Admitting: Cardiothoracic Surgery

## 2011-08-15 VITALS — BP 156/66 | HR 54 | Resp 20 | Ht 67.0 in | Wt 169.0 lb

## 2011-08-15 DIAGNOSIS — I2510 Atherosclerotic heart disease of native coronary artery without angina pectoris: Secondary | ICD-10-CM

## 2011-08-15 DIAGNOSIS — I200 Unstable angina: Secondary | ICD-10-CM

## 2011-08-15 NOTE — Patient Instructions (Signed)
followup with Dr Verdis Prime to discuss another stent to the Right coronary artery

## 2011-08-15 NOTE — Progress Notes (Signed)
PCP is Pearla Dubonnet, MD, MD Referring Provider is Lesleigh Noe, MD                     [address] [place].[address]            [place] [address]          [phone]     CARDIOTHORACIC CONSULTATION  The patient is an 77 year old Caucasian male who is been treated by Dr. Verdis Prime for coronary artery disease with percutaneous intervention to the right coronary since 2009. He currently has 4 stents in the proximal to mid right coronary artery. He recently has developed progressive angina and nocturnal angina and unstable symptoms requiring almost daily nitroglycerin. A relook cath in mid November demonstrated a ostial RCA stenosis before the onset of the stents. Dr. Katrinka Blazing requested and opinion regarding surgical backup of a potential atherectomy and an additional stent to the right coronary. The patient also has moderate 70-80% stenosis of the LAD diagonal and mild to moderate circumflex disease. A ventriculogram was not performed due to elevated creatinine of 2.2. Previous 2-D echocardiogram reports are not available for review. A Cardiolite in 2011 apparently demonstrated EF 60%.   Chief Complaint  Patient presents with  . Coronary Artery Disease    Referral from Dr Katrinka Blazing for surgical eval, coronary artery disease, Cath 08/04/11       Past Medical History  Diagnosis Date  . CAD (coronary artery disease)   . HTN (hypertension)   . CVA (cerebral infarction)   . Osteoarthritis   . Peptic ulcer disease   . Chronic kidney disease   . DDD (degenerative disc disease), lumbar   . BPH (benign prostatic hyperplasia)   . Insomnia     Past Surgical History  Procedure Date  . Coronary angioplasty with stent placement   . Vagotomy and pyloroplasty   . Excision of right palm dypuytrens contracture   . S/p decompressive laminectomy,medial facetectomy and forminotomy l4-l5   . Great toe chilectomy left   . Hemoclipped gastric ulcer 2009  . Lumbar epidural steroid injection  l2-3 2010    No family history on file.  Social History History  Substance Use Topics  . Smoking status: Former Smoker    Quit date: 07/20/1954  . Smokeless tobacco: Not on file  . Alcohol Use: No    Current Outpatient Prescriptions  Medication Sig Dispense Refill  . aspirin 81 MG tablet Take 81 mg by mouth daily.        Marland Kitchen atenolol (TENORMIN) 50 MG tablet Take 50 mg by mouth daily.        . cholecalciferol (VITAMIN D) 1000 UNITS tablet Take 2,000 Units by mouth daily.        . colchicine 0.6 MG tablet Take 0.6 mg by mouth daily.        . furosemide (LASIX) 40 MG tablet Take 40 mg by mouth daily.        . isosorbide mononitrate (IMDUR) 60 MG 24 hr tablet Take 60 mg by mouth daily.        Marland Kitchen levothyroxine (SYNTHROID, LEVOTHROID) 75 MCG tablet Take 75 mcg by mouth daily.        . nitroGLYCERIN (NITROSTAT) 0.4 MG SL tablet Place 0.4 mg under the tongue every 5 (five) minutes as needed.        Marland Kitchen omeprazole (PRILOSEC) 20 MG capsule Take 20 mg by mouth daily.        . Tamsulosin  HCl (FLOMAX) 0.4 MG CAPS Take by mouth.        . vitamin B-12 (CYANOCOBALAMIN) 1000 MCG tablet Take 1,000 mcg by mouth daily.          Allergies  Allergen Reactions  . Penicillins Hives  . Allopurinol   . Benicar (Olmesartan Medoxomil)     Review of Systems the patient's weight is stable. He denies fever weight loss. HEENT exam significant for a traumatic right brain stroke when he was involved in an industrial accident some years ago with a piece of steel hitting his head. He is edentulous with upper and lower plates. Thoracic review is negative for rib fracture pneumothorax or hemoptysis. Cardiac review is positive or coronary disease negative for history of valvular heart disease or arrhythmia. GI review is positive for history of gastric ulcer disease with a remote vagotomy pyloroplasty a more recent endoscopy and clipping of a bleeding gastric ulcer. The patient is able to tolerate currently aspirin.  Neurologic review is positive or 1 previous kidney stone.  Vascular review is positive for claudication of the left calf with walking. Endocrine review negative for diabetes. Hematologic review negative for bleeding disorder other than GI bleeding from his ulcer disease.  BP 156/66  Pulse 54  Resp 20  Ht 5\' 7"  (1.702 m)  Wt 169 lb (76.658 kg)  BMI 26.47 kg/m2  SpO2 93% Physical Exam  GENERAL the patient is 5 foot 7 weighs 165 pounds blood pressure 155/70   HEENT normocephalic upper and lower dental plates NECK left carotid bruit no JVD no palpable adenopathy THORAX breath sounds clear and equal without deformity CARDIAC regular rhythm grade 2/6 flow murmur through the upper right sternal border no S3 gallop ABDOMINAL surgical scar from the xiphoid to the pubis from his vagotomy pyloroplasty no palpable mass or tenderness EXTREMITY no clubbing cyanosis or edema the patient is unable to lift his right arm due to the industrial accident several years ago with injury to the shoulder and rotator cuff VASCULAR nonpalpable pedal pulses NEURO other the weakness of the right upper extremity no focal deficit. The patient was able daily down the hallway approximately 50 feet without difficulty.  Diagnostic Test/Recommendation         The coronary arteriogram is reviewed and the patient has a severe proximal RCA stenosis,  moderate disease of his LAD- diagonal and mild-to-moderate disease of the circumflex. LV function not clearly known. Persistent class IV angina refractory to medical therapy in an 77 year old frail gentleman with renal insufficiency previous stroke episodes of falling at home and peripheral vascular disease. The patient would be extremely high risk for surgical revitalization. I agree with the plan for further percutaneous therapy to be carried out by Dr. Verdis Prime. The patient and family understand that if PCI results in RCA occlusion or dissection then the risk of emergency surgery  would be even higher and that the situation would be discussed with the family again before proceeding with surgery.

## 2012-02-18 ENCOUNTER — Observation Stay (HOSPITAL_COMMUNITY)
Admission: EM | Admit: 2012-02-18 | Discharge: 2012-02-20 | Disposition: A | Discharge status: Home / Self Care | Payer: Medicare Other | Attending: Interventional Cardiology | Admitting: Interventional Cardiology

## 2012-02-18 ENCOUNTER — Encounter (HOSPITAL_COMMUNITY): Payer: Self-pay | Admitting: Emergency Medicine

## 2012-02-18 ENCOUNTER — Emergency Department (HOSPITAL_COMMUNITY): Payer: Medicare Other

## 2012-02-18 DIAGNOSIS — Z8673 Personal history of transient ischemic attack (TIA), and cerebral infarction without residual deficits: Secondary | ICD-10-CM | POA: Insufficient documentation

## 2012-02-18 DIAGNOSIS — K279 Peptic ulcer, site unspecified, unspecified as acute or chronic, without hemorrhage or perforation: Secondary | ICD-10-CM | POA: Insufficient documentation

## 2012-02-18 DIAGNOSIS — I200 Unstable angina: Secondary | ICD-10-CM | POA: Insufficient documentation

## 2012-02-18 DIAGNOSIS — R0609 Other forms of dyspnea: Secondary | ICD-10-CM | POA: Insufficient documentation

## 2012-02-18 DIAGNOSIS — R0789 Other chest pain: Secondary | ICD-10-CM

## 2012-02-18 DIAGNOSIS — I2510 Atherosclerotic heart disease of native coronary artery without angina pectoris: Principal | ICD-10-CM | POA: Insufficient documentation

## 2012-02-18 DIAGNOSIS — R0989 Other specified symptoms and signs involving the circulatory and respiratory systems: Secondary | ICD-10-CM | POA: Insufficient documentation

## 2012-02-18 DIAGNOSIS — R079 Chest pain, unspecified: Secondary | ICD-10-CM

## 2012-02-18 HISTORY — DX: Cerebral infarction, unspecified: I63.9

## 2012-02-18 LAB — PROTIME-INR
INR: 0.99 (ref 0.00–1.49)
Prothrombin Time: 13.3 seconds (ref 11.6–15.2)

## 2012-02-18 LAB — POCT I-STAT TROPONIN I

## 2012-02-18 LAB — BASIC METABOLIC PANEL
CO2: 23 mEq/L (ref 19–32)
Chloride: 108 mEq/L (ref 96–112)
Creatinine, Ser: 2.47 mg/dL — ABNORMAL HIGH (ref 0.50–1.35)
GFR calc Af Amer: 26 mL/min — ABNORMAL LOW (ref >90)
Potassium: 4.4 mEq/L (ref 3.5–5.1)
Sodium: 139 mEq/L (ref 135–145)

## 2012-02-18 LAB — CBC
HCT: 34 % — ABNORMAL LOW (ref 39.0–52.0)
MCHC: 33.2 g/dL (ref 30.0–36.0)
Platelets: 125 10*3/uL — ABNORMAL LOW (ref 150–400)
RDW: 14.1 % (ref 11.5–15.5)
WBC: 4.9 10*3/uL (ref 4.0–10.5)

## 2012-02-18 LAB — DIFFERENTIAL
Basophils Absolute: 0 10*3/uL (ref 0.0–0.1)
Basophils Relative: 0 % (ref 0–1)
Lymphocytes Relative: 16 % (ref 12–46)
Neutro Abs: 3.5 10*3/uL (ref 1.7–7.7)
Neutrophils Relative %: 71 % (ref 43–77)

## 2012-02-18 MED ORDER — TAMSULOSIN HCL 0.4 MG PO CAPS
0.4000 mg | ORAL_CAPSULE | Freq: Every day | ORAL | Status: DC
  Administered 2012-02-19: 0.4 mg via ORAL
  Filled 2012-02-18 (×2): qty 1

## 2012-02-18 MED ORDER — ATENOLOL 50 MG PO TABS
50.0000 mg | ORAL_TABLET | Freq: Every day | ORAL | Status: DC
  Administered 2012-02-19 – 2012-02-20 (×2): 50 mg via ORAL
  Filled 2012-02-18 (×2): qty 1

## 2012-02-18 MED ORDER — ISOSORBIDE MONONITRATE ER 60 MG PO TB24
120.0000 mg | ORAL_TABLET | Freq: Every day | ORAL | Status: DC
  Administered 2012-02-19 – 2012-02-20 (×2): 120 mg via ORAL
  Filled 2012-02-18 (×2): qty 2

## 2012-02-18 MED ORDER — AMLODIPINE BESYLATE 5 MG PO TABS
5.0000 mg | ORAL_TABLET | Freq: Every day | ORAL | Status: DC
  Administered 2012-02-19 – 2012-02-20 (×3): 5 mg via ORAL
  Filled 2012-02-18 (×3): qty 1

## 2012-02-18 MED ORDER — AMLODIPINE BESYLATE 5 MG PO TABS: 5.0000 mg | ORAL_TABLET | Freq: Every day | ORAL | Status: DC

## 2012-02-18 MED ORDER — LEVOTHYROXINE SODIUM 75 MCG PO TABS
75.0000 ug | ORAL_TABLET | Freq: Every day | ORAL | Status: DC
  Administered 2012-02-19 – 2012-02-20 (×2): 75 ug via ORAL
  Filled 2012-02-18 (×2): qty 1

## 2012-02-18 MED ORDER — PANTOPRAZOLE SODIUM 40 MG PO TBEC
40.0000 mg | DELAYED_RELEASE_TABLET | Freq: Every day | ORAL | Status: DC
  Administered 2012-02-19 (×2): 40 mg via ORAL
  Filled 2012-02-18: qty 1

## 2012-02-18 MED ORDER — VITAMIN B-12 1000 MCG PO TABS
1000.0000 ug | ORAL_TABLET | Freq: Every day | ORAL | Status: DC
  Administered 2012-02-19 – 2012-02-20 (×2): 1000 ug via ORAL
  Filled 2012-02-18 (×2): qty 1

## 2012-02-18 MED ORDER — COQ-10 100 MG PO CAPS: 1.0000 | ORAL_CAPSULE | Freq: Every day | ORAL | Status: DC

## 2012-02-18 MED ORDER — VITAMIN D3 25 MCG (1000 UNIT) PO TABS
1000.0000 [IU] | ORAL_TABLET | Freq: Every day | ORAL | Status: DC
  Administered 2012-02-19 – 2012-02-20 (×2): 1000 [IU] via ORAL
  Filled 2012-02-18 (×2): qty 1

## 2012-02-18 NOTE — ED Provider Notes (Signed)
History     CSN: [id number]  Arrival date & time 02/18/12  2101   First MD Initiated Contact with Patient 02/18/12 2133      Chief Complaint  Patient presents with  . Chest Pain    (Consider location/radiation/quality/duration/timing/severity/associated sxs/prior treatment) HPI Comments: History of CAD with stents in the past.  Had cath done about a year ago showing two blockages they could not fix without bypass.  They tried medical management.  Has been having more discomfort over the past week which improves with ntg.  Had a bad episode this evening.    Patient is a 78 y.o. male presenting with chest pain. The history is provided by the patient.  Chest Pain Episode onset: last week. Chest pain occurs intermittently. The chest pain is worsening. The pain is associated with breathing and exertion. The severity of the pain is moderate. The quality of the pain is described as tightness. The pain does not radiate. Chest pain is worsened by exertion. Primary symptoms include fatigue and nausea. Pertinent negatives for primary symptoms include no fever and no cough.     Past Medical History  Diagnosis Date  . CAD (coronary artery disease)   . HTN (hypertension)   . CVA (cerebral infarction)   . Osteoarthritis   . Peptic ulcer disease   . Chronic kidney disease   . DDD (degenerative disc disease), lumbar   . BPH (benign prostatic hyperplasia)   . Insomnia     Past Surgical History  Procedure Date  . Coronary angioplasty with stent placement   . Vagotomy and pyloroplasty   . Excision of right palm dypuytrens contracture   . S/p decompressive laminectomy,medial facetectomy and forminotomy l4-l5   . Great toe chilectomy left   . Hemoclipped gastric ulcer 2009  . Lumbar epidural steroid injection l2-3 2010    No family history on file.  History  Substance Use Topics  . Smoking status: Former Smoker    Quit date: 07/20/1954  . Smokeless tobacco: Not on file  . Alcohol Use:  No      Review of Systems  Constitutional: Positive for fatigue. Negative for fever.  Respiratory: Negative for cough.   Cardiovascular: Positive for chest pain.  Gastrointestinal: Positive for nausea.  All other systems reviewed and are negative.    Allergies  Penicillins; Allopurinol; and Benicar  Home Medications   Current Outpatient Rx  Name Route Sig Dispense Refill  . ASPIRIN EC 81 MG PO TBEC Oral Take 81 mg by mouth daily.    . ATENOLOL 50 MG PO TABS Oral Take 50 mg by mouth daily.      Marland Kitchen VITAMIN D 1000 UNITS PO TABS Oral Take 1,000 Units by mouth daily.     . COQ-10 100 MG PO CAPS Oral Take 1 capsule by mouth daily.    . COLCHICINE 0.6 MG PO TABS Oral Take 0.6 mg by mouth 2 (two) times daily as needed. For gout    . FUROSEMIDE 40 MG PO TABS Oral Take 40 mg by mouth daily as needed. For fluid    . ISOSORBIDE MONONITRATE ER 120 MG PO TB24 Oral Take 120 mg by mouth daily.    Marland Kitchen LEVOTHYROXINE SODIUM 75 MCG PO TABS Oral Take 75 mcg by mouth daily.     Marland Kitchen NITROGLYCERIN 0.4 MG SL SUBL Sublingual Place 0.4 mg under the tongue every 5 (five) minutes as needed. For chest pain    . OMEPRAZOLE 20 MG PO CPDR Oral Take 20  mg by mouth daily.      Marland Kitchen TAMSULOSIN HCL 0.4 MG PO CAPS Oral Take 0.4 mg by mouth at bedtime.     Marland Kitchen VITAMIN B-12 1000 MCG PO TABS Oral Take 1,000 mcg by mouth daily.        BP 176/70  Pulse 70  Temp(Src) 97.7 F (36.5 C) (Oral)  Resp 16  SpO2 96%  Physical Exam  Nursing note and vitals reviewed. Constitutional: He is oriented to person, place, and time. He appears well-developed and well-nourished. No distress.  HENT:  Head: Normocephalic and atraumatic.  Mouth/Throat: Oropharynx is clear and moist.  Neck: Normal range of motion. Neck supple. No thyromegaly present.  Cardiovascular: Normal rate and regular rhythm.   Murmur heard. Pulmonary/Chest: Effort normal and breath sounds normal. No respiratory distress. He has no wheezes.  Abdominal: Soft. Bowel  sounds are normal. He exhibits no distension. There is no tenderness.  Musculoskeletal: Normal range of motion. He exhibits no edema.  Neurological: He is alert and oriented to person, place, and time.  Skin: Skin is warm and dry. He is not diaphoretic.    ED Course  Procedures (including critical care time)   Labs Reviewed  CBC  DIFFERENTIAL  BASIC METABOLIC PANEL  PROTIME-INR   Dg Chest 2 View  02/18/2012  *RADIOLOGY REPORT*  Clinical Data: Chest pain and shortness of breath today.  CHEST - 2 VIEW  Comparison: 08/01/2011  Findings: Shallow inspiration.  Cardiac enlargement with mild pulmonary vascular congestion.  No edema.  No blunting of costophrenic angles.  No focal airspace consolidation.  Scattered calcified granulomas.  No pneumothorax.  Calcification and torsion of the aorta.  Surgical clips in the EG junction.  The degenerative changes in the spine.  IMPRESSION: Shallow inspiration.  Cardiac enlargement with mild pulmonary vascular congestion.  No focal consolidation.  Original Report Authenticated By: Marlon Pel, M.D.     No diagnosis found.   Date: 02/18/2012  Rate: 67  Rhythm: normal sinus rhythm  QRS Axis: left  Intervals: normal  ST/T Wave abnormalities: ST depressions laterally  Conduction Disutrbances:none  Narrative Interpretation:   Old EKG Reviewed: changes noted    MDM  I have spoken with Dr. Anne Fu who will send the fellow to admit the patient.          Geoffery Lyons, MD 02/18/12 [phone]

## 2012-02-18 NOTE — Progress Notes (Signed)
PHARMACIST - PHYSICIAN ORDER COMMUNICATION  CONCERNING: P&T Medication Policy on Herbal Medications  DESCRIPTION:  This patient's order for:  Co Q 10  has been noted.  This product(s) is classified as an "herbal" or natural product. Due to a lack of definitive safety studies or FDA approval, nonstandard manufacturing practices, plus the potential risk of unknown drug-drug interactions while on inpatient medications, the Pharmacy and Therapeutics Committee does not permit the use of "herbal" or natural products of this type within [facility].   ACTION TAKEN: The pharmacy department is unable to verify this order at this time and your patient has been informed of this safety policy. Please reevaluate patient's clinical condition at discharge and address if the herbal or natural product(s) should be resumed at that time.   Herby Abraham, Pharm.D. [phone] 02/18/2012 11:39 PM

## 2012-02-18 NOTE — ED Notes (Signed)
PT. ARRIVED WITH EMS FROM HOME , REPORTS INTERMITTENT MID CHEST PAIN FOR 3 WEEKS , TOOK 2 NTG SL AT HOME WITH NO RELIEF, RECEIVE 4 BABY ASA BY EMS PTA , DENIES CHEST PAIN AT ARRIVAL 0/10 , NO SOB , SLIGHT NAUSEA.

## 2012-02-19 ENCOUNTER — Encounter (HOSPITAL_COMMUNITY): Payer: Self-pay | Admitting: *Deleted

## 2012-02-19 LAB — CBC
Hemoglobin: 11.2 g/dL — ABNORMAL LOW (ref 13.0–17.0)
MCH: 30.8 pg (ref 26.0–34.0)
MCHC: 33.2 g/dL (ref 30.0–36.0)
RDW: 14.1 % (ref 11.5–15.5)

## 2012-02-19 LAB — HEPARIN LEVEL (UNFRACTIONATED): Heparin Unfractionated: 0.17 IU/mL — ABNORMAL LOW (ref 0.30–0.70)

## 2012-02-19 LAB — CARDIAC PANEL(CRET KIN+CKTOT+MB+TROPI)
Relative Index: 4.5 — ABNORMAL HIGH (ref 0.0–2.5)
Relative Index: 4.5 — ABNORMAL HIGH (ref 0.0–2.5)
Total CK: 164 U/L (ref 7–232)
Troponin I: <0.3 ng/mL (ref <0.30)
Troponin I: <0.3 ng/mL (ref <0.30)

## 2012-02-19 MED ORDER — ONDANSETRON HCL 4 MG/2ML IJ SOLN: 4.0000 mg | Freq: Four times a day (QID) | INTRAMUSCULAR | Status: DC | PRN

## 2012-02-19 MED ORDER — HEPARIN BOLUS VIA INFUSION
1500.0000 [IU] | Freq: Once | INTRAVENOUS | Status: DC
  Filled 2012-02-19: qty 1500

## 2012-02-19 MED ORDER — NITROGLYCERIN 0.4 MG SL SUBL: 0.4000 mg | SUBLINGUAL_TABLET | SUBLINGUAL | Status: DC | PRN

## 2012-02-19 MED ORDER — SIMVASTATIN 20 MG PO TABS
20.0000 mg | ORAL_TABLET | Freq: Every day | ORAL | Status: DC
  Administered 2012-02-19: 20 mg via ORAL
  Filled 2012-02-19 (×2): qty 1

## 2012-02-19 MED ORDER — ASPIRIN 81 MG PO CHEW: 324.0000 mg | CHEWABLE_TABLET | ORAL | Status: DC

## 2012-02-19 MED ORDER — ENOXAPARIN SODIUM 40 MG/0.4ML ~~LOC~~ SOLN
40.0000 mg | SUBCUTANEOUS | Status: DC
  Administered 2012-02-19: 40 mg via SUBCUTANEOUS
  Filled 2012-02-19 (×2): qty 0.4

## 2012-02-19 MED ORDER — HEPARIN (PORCINE) IN NACL 100-0.45 UNIT/ML-% IJ SOLN
1050.0000 [IU]/h | INTRAMUSCULAR | Status: DC
  Administered 2012-02-19: 850 [IU]/h via INTRAVENOUS
  Filled 2012-02-19: qty 250

## 2012-02-19 MED ORDER — ACETAMINOPHEN 325 MG PO TABS: 650.0000 mg | ORAL_TABLET | ORAL | Status: DC | PRN

## 2012-02-19 MED ORDER — ASPIRIN 300 MG RE SUPP: 300.0000 mg | RECTAL | Status: DC

## 2012-02-19 MED ORDER — HEPARIN BOLUS VIA INFUSION
4000.0000 [IU] | Freq: Once | INTRAVENOUS | Status: AC
  Administered 2012-02-19: 4000 [IU] via INTRAVENOUS
  Filled 2012-02-19: qty 4000

## 2012-02-19 MED ORDER — ENOXAPARIN SODIUM 40 MG/0.4ML ~~LOC~~ SOLN
40.0000 mg | SUBCUTANEOUS | Status: DC
  Filled 2012-02-19: qty 0.4

## 2012-02-19 MED ORDER — ASPIRIN EC 81 MG PO TBEC
81.0000 mg | DELAYED_RELEASE_TABLET | Freq: Every day | ORAL | Status: DC
  Administered 2012-02-20: 81 mg via ORAL
  Filled 2012-02-19: qty 1

## 2012-02-19 NOTE — ED Notes (Signed)
TRANSPORTED TO FLOOR IN STABLE CONDITION , NO CHEST PAIN , RESPIRATIONS UNLABORED , IV SITE UNREMARKABLE.

## 2012-02-19 NOTE — H&P (Signed)
NAMEKADIN, CANIPE NO.:  [id number]  MEDICAL RECORD NO.:  [id number]  LOCATION:  [id number]                        FACILITY:  [facility]  PHYSICIAN:  Natasha Bence, MD       DATE OF BIRTH:  08-20-1923  DATE OF ADMISSION:  02/18/2012 DATE OF DISCHARGE:                             HISTORY & PHYSICAL   PRIMARY CARDIOLOGIST:  Lyn Records, MD  CHIEF COMPLAINT:  Chest discomfort.  HISTORY OF PRESENT ILLNESS:  Mr. Holzer is a 78 year old white male with known three-vessel coronary artery disease, who presented to the emergency department tonight with recurrent chest discomfort.  He reports having several episodes since his catheterization last November; however, over the past week, they have increased in intensity and frequency.  Usually, they are easily resolved with sublingual nitroglycerin.  However, today he experienced a severe episode, which was not relieved with 2 sublingual nitroglycerin.  It was associated with shortness of breath, nausea, and diaphoresis.  This is typical of his angina except more severe.  The pain is usually brought on by exertion.  He also describes another chest heaviness and burning that is worsened with eating, however, this was distinctly different.  Usually, he gets relief with resting; however, this pain lasted over 30 minutes despite 2 nitroglycerin.  So, he presented to the emergency department. Previously, it was discussed whether or not to proceed with PCI of his right coronary artery versus bypass surgery, and it was decided to treated with medical management, and he has been doing fairly well up until now, but over the last week, he has had certain increase in his anginal symptoms.  Otherwise, he denies any PND or orthopnea.  He has not had any palpitations, lightheadedness or syncope.  He denies any bleeding or melena.  He does have what sounds like to me some occasional heartburn as well.  Otherwise complete review of systems  was performed and was negative except for what was stated above.  PAST MEDICAL HISTORY: 1. Coronary artery disease, status post multiple PCIs to his right     coronary artery.  Most recent catheterization showed 80% stenosis     in the mid LAD, also with 80% ostial stenosis of his first and     second diagonal branches.  Left circumflex had a moderate-to-severe     stenosis with 50-70% midvessel obstruction and right coronary     artery has overlapping stents in the proximal to midsegment with     80% stenosis in the ostium of the right coronary artery. 2. He has hypertension. 3. He has dyslipidemia. 4. History of stroke. 5. History of peptic ulcer disease and anemia. 6. Osteoarthritis. 7. Chronic kidney disease. 8. BPH.  PAST SURGICAL HISTORY: 1. He had angioplasty and stent placement of his right coronary     artery. 2. He has had a vagotomy and pyloroplasty. 3. Excision of Dupuytren's contracture in his right palm. 4. Decompressive laminectomy of L4-L5.  He has had toe surgery, a     clipped gastric ulcer.  FAMILY HISTORY:  Noncontributory.  SOCIAL HISTORY:  He is a former smoker, quit over 50 years ago.  He denies any alcohol or illicit drug  use.  He is a fairly active man.  ALLERGIES:  He is allergic to PENICILLIN, ALLOPURINOL and BENICAR.  HOME MEDICATIONS:  He is on; 1. Aspirin 81 mg p.o. daily. 2. Tenormin 50 mg p.o. daily. 3. Vitamin D 1000 units p.o. daily. 4. Coenzyme Q10 1 tab daily. 5. Colchicine 0.6 mg b.i.d. p.r.n. 6. Lasix 40 mg p.r.n. 7. Imdur 120 mg p.o. daily. 8. Levothroid 75 mcg p.o. daily. 9. Nitroglycerin p.r.n. 10.Omeprazole 20 mg p.o. daily. 11.Flomax 0.4 mg p.o. at bedtime. 12.Vitamin B12 1000 mcg p.o. daily.  PHYSICAL EXAMINATION:  VITAL SIGNS:  He is afebrile with temperature of 97.7, pulse was 70 and regular, respiratory rate of 16, blood pressure of 176/70, O2 sats 96% on room air. GENERAL:  He is a well-developed, well-nourished, white  male, in no apparent distress. HEENT:  Eyes, he has anicteric sclerae.  Mucous membranes are moist. NECK:  He has normal jugular venous pressure.  No carotid bruits. LUNGS:  Clear to auscultation bilaterally. CARDIOVASCULAR:  He has a 3/6 systolic murmur heard at the left sternal border.  He has no rubs or gallops ABDOMEN:  Soft, nontender, and nondistended.  No masses or bruits. EXTREMITIES:  Warm with no edema.  He has symmetrical pulses throughout. NEUROLOGIC:  Grossly afocal.  He is awake, alert and oriented. SKIN:  No rashes or ulcers.  LABORATORY DATA:  Sodium 139, potassium 4.4, chloride of 108, bicarb of 23, BUN of 41, creatinine of 2.47, calcium of 8.7, glucose of 124. Troponin 0.08.  White blood cell count of 4.9 with normal differential, hematocrit of 34, platelet count of 125.  PT is 13.3, INR was 0.99.  Chest x-ray shows mild vascular congestion.  Otherwise, no infiltrates or effusions.  EKG shows a sinus mechanism with a rate of 67 beats per minute.  He has LVH with secondary repolarization changes, possibly some new nonspecific ST changes in his anterolateral leads.  IMPRESSION AND PLAN:  This is an 78 year old male with known three- vessel coronary artery disease, who presents with increasing anginal symptoms.  We will admit him with a diagnosis of unstable angina. Initial troponin is negative.  EKG is unrevealing without much change from previous ones.  He is currently pain free.  We will go ahead and start him on heparin infusion and place him on telemetry and monitor serial cardiac enzymes.  He is not on the statin.  Currently, we will add this as well.  He is on a fair antianginal regimen with atenolol and Imdur; however, his blood pressure is also severely elevated over 170 systolic, which may be contributory or could possibly be related to his pain; however, he reports his blood pressure has been running pretty consistently in the 140s-190s systolic.  We will  go ahead and also add him on Norvasc for better blood pressure control as well as some antianginal effect.  Otherwise, he will be on aspirin and beta-blocker as well.  We will also continue his Imdur.  Depending on how  his labs and left ventricular function look and how he does clinicall,  we will discuss long-term options of medical therapy versus possibly bypass surgery.  We will also obtain an echocardiogram.  He has a systolic murmur as well.          ______________________________ Natasha Bence, MD     MH/MEDQ  D:  02/18/2012  T:  02/19/2012  Job:  [id number]

## 2012-02-19 NOTE — ED Notes (Signed)
CALLED 2000 FLOOR TO GIVE REPORT, NOT READY AT THIS TIME , WILL CALL ER WHEN READY.

## 2012-02-19 NOTE — Progress Notes (Signed)
ANTICOAGULATION CONSULT NOTE - Initial Consult  Pharmacy Consult for Heparin Indication: chest pain/ACS  Allergies  Allergen Reactions  . Penicillins Hives  . Allopurinol Other (See Comments)    Unknown   . Benicar (Olmesartan Medoxomil) Other (See Comments)    unknown    Patient Measurements: Weight: 161 lb 3.2 oz (73.12 kg) Heparin Dosing Weight: 73 kg  Vital Signs: Temp: 97.3 F (36.3 C) (06/02 0113) Temp src: Oral (06/02 0113) BP: 168/72 mmHg (06/02 0113) Pulse Rate: 58  (06/02 0113)  Labs:  Alvira Philips 02/18/12 2149  HGB 11.3*  HCT 34.0*  PLT 125*  APTT --  LABPROT 13.3  INR 0.99  HEPARINUNFRC --  CREATININE 2.47*  CKTOTAL --  CKMB --  TROPONINI --    The CrCl is unknown because both a height and weight (above a minimum accepted value) are required for this calculation.   Medical History: Past Medical History  Diagnosis Date  . CAD (coronary artery disease)   . HTN (hypertension)   . CVA (cerebral infarction)   . Osteoarthritis   . Peptic ulcer disease   . Chronic kidney disease   . DDD (degenerative disc disease), lumbar   . BPH (benign prostatic hyperplasia)   . Insomnia   . Stroke     Assessment: 34 YOM with h/o CAD presented with chest pain to start IV heparin. hgb 11.3, plt 125, no anticoagulation prior to admission.  Goal of Therapy:  Heparin level 0.3-0.7 units/ml Monitor platelets by anticoagulation protocol: Yes   Plan:  - Heparin bolus 4000 units x 1 then heparin infusion 850 units/hr - f/u 8 hr heparin level at 1000 - daily cbc and heparin level with am labs from 6/3   Bayard Hugger, PharmD, BCPS  Clinical Pharmacist  Pager: [phone]  02/19/2012,1:36 AM

## 2012-02-19 NOTE — Progress Notes (Signed)
Subjective:  78 year old with CAD, CKD, not felt to be a CABG candidate (Dr. Maren Beach note reviewed) here with [place].  This am felt very mild throat pain, nothing severe.  No SOB.   Objective:  Vital Signs in the last 24 hours: Temp:  [97.3 F (36.3 C)-97.7 F (36.5 C)] 97.5 F (36.4 C) (06/02 0533) Pulse Rate:  [57-86] 57  (06/02 0533) Resp:  [16] 16  (06/02 0533) BP: (136-176)/(61-72) 136/69 mmHg (06/02 0533) SpO2:  [96 %-100 %] 97 % (06/02 0533) Weight:  [73.12 kg (161 lb 3.2 oz)] 73.12 kg (161 lb 3.2 oz) (06/02 0139)   Physical Exam: General: Well developed, well nourished, in no acute distress.Elderly Head:  Normocephalic and atraumatic. Lungs: Clear to auscultation and percussion. Heart: Normal S1 and S2.  3/6 S murmur LUSB, no rubs or gallops.  Pulses: Pulses normal in all 4 extremities. DP's included.  Abdomen: soft, non-tender, positive bowel sounds. Extremities: No clubbing or cyanosis. No edema. Neurologic: Alert and oriented x 3.    Lab Results:  Basename 02/19/12 0547 02/18/12 2149  WBC 5.2 4.9  HGB 11.2* 11.3*  PLT 123* 125*    Basename 02/18/12 2149  NA 139  K 4.4  CL 108  CO2 23  GLUCOSE 124*  BUN 41*  CREATININE 2.47*    Basename 02/19/12 0746 02/19/12 0157  TROPONINI <0.30 <0.30  Imaging: Dg Chest 2 View  02/18/2012  *RADIOLOGY REPORT*  Clinical Data: Chest pain and shortness of breath today.  CHEST - 2 VIEW  Comparison: 08/01/2011  Findings: Shallow inspiration.  Cardiac enlargement with mild pulmonary vascular congestion.  No edema.  No blunting of costophrenic angles.  No focal airspace consolidation.  Scattered calcified granulomas.  No pneumothorax.  Calcification and torsion of the aorta.  Surgical clips in the EG junction.  The degenerative changes in the spine.  IMPRESSION: Shallow inspiration.  Cardiac enlargement with mild pulmonary vascular congestion.  No focal consolidation.  Original Report Authenticated By: Marlon Pel, M.D.    Personally viewed.   Telemetry: No adverse rhythms, mostly SB 50's Personally viewed.   Assessment/Plan:   [place]/CAD  - I will change heparin to [place] Lovenox.   - Troponin normal  - amlodipine added 5mg  as antianginal.  - No room to increase atenolol due to bradycardia.  - Imdur at 120.   - BP has been at times elevated here as well - ? If playing a role in symptoms. Wife says that BP usually goes up at MD's office.   - Challenging situation, especially with Creat 2.5. Cath high risk for ESRD. Discussed with wife and patient.   - ?Ranexa in future  CKD 4  - monitor creat.   - avoid NSAIDs, ACE-I  HL  - simvastatin  Will monitor today     [name] 02/19/2012, 11:44 AM

## 2012-02-19 NOTE — Progress Notes (Signed)
ANTICOAGULATION CONSULT NOTE - Follow Up Consult  Pharmacy Consult for Heparin Indication: chest pain/ACS  Allergies  Allergen Reactions  . Penicillins Hives  . Allopurinol Other (See Comments)    Unknown   . Benicar (Olmesartan Medoxomil) Other (See Comments)    unknown    Patient Measurements: Height: 6' (182.9 cm) Weight: 161 lb 3.2 oz (73.12 kg) IBW/kg (Calculated) : 77.6  Heparin Dosing Weight:   Vital Signs: Temp: 97.5 F (36.4 C) (06/02 0533) Temp src: Oral (06/02 0533) BP: 136/69 mmHg (06/02 0533) Pulse Rate: 57  (06/02 0533)  Labs:  Basename 02/19/12 1005 02/19/12 0746 02/19/12 0547 02/19/12 0157 02/18/12 2149  HGB -- -- 11.2* -- 11.3*  HCT -- -- 33.7* -- 34.0*  PLT -- -- 123* -- 125*  APTT -- -- -- -- --  LABPROT -- -- -- -- 13.3  INR -- -- -- -- 0.99  HEPARINUNFRC 0.17* -- -- -- --  CREATININE -- -- -- -- 2.47*  CKTOTAL -- 204 -- 204 --  CKMB -- 9.2* -- 9.2* --  TROPONINI -- <0.30 -- <0.30 --    Estimated Creatinine Clearance: 22.2 ml/min (by C-G formula based on Cr of 2.47).   Medications:  Scheduled:    . amLODipine  5 mg Oral Daily  . aspirin EC  81 mg Oral Daily  . atenolol  50 mg Oral Daily  . cholecalciferol  1,000 Units Oral Daily  . heparin  4,000 Units Intravenous Once  . isosorbide mononitrate  120 mg Oral Daily  . levothyroxine  75 mcg Oral Daily  . pantoprazole  40 mg Oral Q1200  . simvastatin  20 mg Oral q1800  . Tamsulosin HCl  0.4 mg Oral QHS  . vitamin B-12  1,000 mcg Oral Daily  . DISCONTD: amLODipine  5 mg Oral Daily  . DISCONTD: aspirin  324 mg Oral NOW  . DISCONTD: aspirin  300 mg Rectal NOW  . DISCONTD: CoQ-10  1 capsule Oral Daily    Assessment: 78yo male with chest pain.  Heparin level this AM is sub-therapeutic on 850 units/hr.  No problems noted.    Goal of Therapy:  Heparin level 0.3-0.7 units/ml Monitor platelets by anticoagulation protocol: Yes   Plan:  1.  Heparin 1500 units IV x 1, then inc to 1050  units/hr 2.  Heparin level in 6hr  Marisue Humble, PharmD Clinical Pharmacist [facility]

## 2012-02-20 LAB — BASIC METABOLIC PANEL
CO2: 22 mEq/L (ref 19–32)
Calcium: 8.5 mg/dL (ref 8.4–10.5)
Creatinine, Ser: 2.13 mg/dL — ABNORMAL HIGH (ref 0.50–1.35)
Glucose, Bld: 108 mg/dL — ABNORMAL HIGH (ref 70–99)

## 2012-02-20 MED ORDER — CLOPIDOGREL BISULFATE 75 MG PO TABS: 75.0000 mg | ORAL_TABLET | Freq: Every day | ORAL | Status: DC

## 2012-02-20 MED ORDER — SIMVASTATIN 20 MG PO TABS: 20.0000 mg | ORAL_TABLET | Freq: Every day | ORAL | Status: DC

## 2012-02-20 MED ORDER — ASPIRIN 81 MG PO TBEC: 81.0000 mg | DELAYED_RELEASE_TABLET | Freq: Every day | ORAL | Status: DC

## 2012-02-20 MED ORDER — AMLODIPINE BESYLATE 5 MG PO TABS: 5.0000 mg | ORAL_TABLET | Freq: Every day | ORAL | Status: DC

## 2012-02-20 NOTE — Progress Notes (Signed)
Utilization review complete 

## 2012-02-20 NOTE — Discharge Summary (Signed)
Patient ID: [name] MRN: [id number] DOB/AGE: Dec 30, 1922 78 y.o.  Admit date: 02/18/2012 Discharge date: 02/20/2012  Primary Discharge Diagnosis: Unstable angina pectoris, with control (titration of medications   Secondary Discharge Diagnosis: 1. Coronary artery disease with 80% ostial RCA,  80% mid LAD and moderate circumflex and diagonal disease by catheterization in 2012  2. Prior history of stroke  3. Chronic kidney disease, stage IV  4. Hypertension, poorly controlled  5. History of peptic ulcer disease  Significant Diagnostic Studies: None  Consults: None  Hospital Course: The patient was admitted to the hospital after having several unprovoked episodes of angina. He was found to have severely elevated blood pressure. Medication titration was performed. His troponins were negative. CK-MBs were mildly elevated. After control of blood pressure he was able to ambulate without significant angina. Considering the patient's renal and sufficiency and age, we have decided to proceed with chronic medical therapy of coronary disease.   Discharge Exam: Blood pressure 119/59, pulse 57, temperature 97.4 F (36.3 C), temperature source Oral, resp. rate 16, height 6' (1.829 m), weight 73.12 kg (161 lb 3.2 oz), SpO2 98.00%.   one of 6 systolic murmur is heard. The exam is otherwise unremarkable. Labs:   Lab Results  Component Value Date   WBC 5.2 02/19/2012   HGB 11.2* 02/19/2012   HCT 33.7* 02/19/2012   MCV 92.6 02/19/2012   PLT 123* 02/19/2012    Lab 02/20/12 0514  NA 138  K 4.9  CL 107  CO2 22  BUN 39*  CREATININE 2.13*  CALCIUM 8.5  PROT --  BILITOT --  ALKPHOS --  ALT --  AST --  GLUCOSE 108*   Lab Results  Component Value Date   CKTOTAL 164 02/19/2012   CKMB 7.8* 02/19/2012   TROPONINI <0.30 02/19/2012    No results found for this basename: CHOL   No results found for this basename: HDL   No results found for this basename: LDLCALC   No results found for this  basename: TRIG   No results found for this basename: CHOLHDL   No results found for this basename: LDLDIRECT      Radiology:Clinical Data: Chest pain and shortness of breath today.  CHEST - 2 VIEW  Comparison: 08/01/2011  Findings: Shallow inspiration. Cardiac enlargement with mild  pulmonary vascular congestion. No edema. No blunting of  costophrenic angles. No focal airspace consolidation. Scattered  calcified granulomas. No pneumothorax. Calcification and torsion  of the aorta. Surgical clips in the EG junction. The degenerative  changes in the spine.  IMPRESSION:  Shallow inspiration. Cardiac enlargement with mild pulmonary  vascular congestion. No focal consolidation.  Original Report Authenticated By: Marlon Pel, M.D.  EKG: Normal sinus rhythm. Prominent precordial T waves. A repeat tracing was performed  FOLLOW UP PLANS AND APPOINTMENTS  Medication List  As of 02/20/2012  9:02 AM   TAKE these medications         amLODipine 5 MG tablet   Commonly known as: NORVASC   Take 1 tablet (5 mg total) by mouth daily.      aspirin EC 81 MG tablet   Take 81 mg by mouth daily.      atenolol 50 MG tablet   Commonly known as: TENORMIN   Take 50 mg by mouth daily.      cholecalciferol 1000 UNITS tablet   Commonly known as: VITAMIN D   Take 1,000 Units by mouth daily.  clopidogrel 75 MG tablet   Commonly known as: PLAVIX   Take 1 tablet (75 mg total) by mouth daily.      colchicine 0.6 MG tablet   Take 0.6 mg by mouth 2 (two) times daily as needed. For gout      CoQ-10 100 MG Caps   Take 1 capsule by mouth daily.      furosemide 40 MG tablet   Commonly known as: LASIX   Take 40 mg by mouth daily as needed. For fluid      isosorbide mononitrate 120 MG 24 hr tablet   Commonly known as: IMDUR   Take 120 mg by mouth daily.      levothyroxine 75 MCG tablet   Commonly known as: SYNTHROID, LEVOTHROID   Take 75 mcg by mouth daily.      nitroGLYCERIN 0.4 MG SL  tablet   Commonly known as: NITROSTAT   Place 0.4 mg under the tongue every 5 (five) minutes as needed. For chest pain      omeprazole 20 MG capsule   Commonly known as: PRILOSEC   Take 20 mg by mouth daily.      simvastatin 20 MG tablet   Commonly known as: ZOCOR   Take 1 tablet (20 mg total) by mouth daily at 6 PM.      Tamsulosin HCl 0.4 MG Caps   Commonly known as: FLOMAX   Take 0.4 mg by mouth at bedtime.      vitamin B-12 1000 MCG tablet   Commonly known as: CYANOCOBALAMIN   Take 1,000 mcg by mouth daily.           Follow-up Information    Follow up with Lesleigh Noe, MD on 02/29/2012. (9:30 A)    Contact information:   [address] [place] [address] [place] [address] [phone]          BRING ALL MEDICATIONS WITH YOU TO FOLLOW UP APPOINTMENTS  Time spent with patient to include physician time: 20 minutes Signed: Lesleigh Noe 02/20/2012, 9:02 AM

## 2012-03-27 ENCOUNTER — Other Ambulatory Visit: Payer: Self-pay

## 2012-03-27 ENCOUNTER — Encounter (HOSPITAL_COMMUNITY): Payer: Self-pay | Admitting: Emergency Medicine

## 2012-03-27 ENCOUNTER — Emergency Department (HOSPITAL_COMMUNITY)
Admission: EM | Admit: 2012-03-27 | Discharge: 2012-03-27 | Disposition: A | Discharge status: Home / Self Care | Payer: Medicare Other | Attending: Emergency Medicine | Admitting: Emergency Medicine

## 2012-03-27 ENCOUNTER — Emergency Department (HOSPITAL_COMMUNITY): Payer: Medicare Other

## 2012-03-27 DIAGNOSIS — Z8673 Personal history of transient ischemic attack (TIA), and cerebral infarction without residual deficits: Secondary | ICD-10-CM | POA: Insufficient documentation

## 2012-03-27 DIAGNOSIS — R55 Syncope and collapse: Secondary | ICD-10-CM | POA: Insufficient documentation

## 2012-03-27 DIAGNOSIS — IMO0002 Reserved for concepts with insufficient information to code with codable children: Secondary | ICD-10-CM | POA: Insufficient documentation

## 2012-03-27 DIAGNOSIS — R5381 Other malaise: Secondary | ICD-10-CM | POA: Insufficient documentation

## 2012-03-27 DIAGNOSIS — Z7982 Long term (current) use of aspirin: Secondary | ICD-10-CM | POA: Insufficient documentation

## 2012-03-27 DIAGNOSIS — Z9861 Coronary angioplasty status: Secondary | ICD-10-CM | POA: Insufficient documentation

## 2012-03-27 DIAGNOSIS — R42 Dizziness and giddiness: Secondary | ICD-10-CM | POA: Insufficient documentation

## 2012-03-27 DIAGNOSIS — N189 Chronic kidney disease, unspecified: Secondary | ICD-10-CM | POA: Insufficient documentation

## 2012-03-27 DIAGNOSIS — M79609 Pain in unspecified limb: Secondary | ICD-10-CM | POA: Insufficient documentation

## 2012-03-27 DIAGNOSIS — I129 Hypertensive chronic kidney disease with stage 1 through stage 4 chronic kidney disease, or unspecified chronic kidney disease: Secondary | ICD-10-CM | POA: Insufficient documentation

## 2012-03-27 DIAGNOSIS — I2510 Atherosclerotic heart disease of native coronary artery without angina pectoris: Secondary | ICD-10-CM | POA: Insufficient documentation

## 2012-03-27 DIAGNOSIS — Z79899 Other long term (current) drug therapy: Secondary | ICD-10-CM | POA: Insufficient documentation

## 2012-03-27 DIAGNOSIS — M1990 Unspecified osteoarthritis, unspecified site: Secondary | ICD-10-CM | POA: Insufficient documentation

## 2012-03-27 LAB — CBC
MCH: 31 pg (ref 26.0–34.0)
MCHC: 33.8 g/dL (ref 30.0–36.0)
MCV: 91.8 fL (ref 78.0–100.0)
Platelets: 170 10*3/uL (ref 150–400)
RBC: 4.29 MIL/uL (ref 4.22–5.81)
RDW: 13.9 % (ref 11.5–15.5)

## 2012-03-27 LAB — BASIC METABOLIC PANEL
CO2: 28 mEq/L (ref 19–32)
Calcium: 9.3 mg/dL (ref 8.4–10.5)
Creatinine, Ser: 2.79 mg/dL — ABNORMAL HIGH (ref 0.50–1.35)
GFR calc non Af Amer: 19 mL/min — ABNORMAL LOW (ref >90)
Sodium: 137 mEq/L (ref 135–145)

## 2012-03-27 LAB — POCT I-STAT TROPONIN I: Troponin i, poc: 0.01 ng/mL (ref 0.00–0.08)

## 2012-03-27 MED ORDER — TRAMADOL HCL 50 MG PO TABS
50.0000 mg | ORAL_TABLET | Freq: Once | ORAL | Status: AC
  Administered 2012-03-27: 50 mg via ORAL
  Filled 2012-03-27: qty 1

## 2012-03-27 NOTE — ED Provider Notes (Signed)
Pt seen with resident.  Pt had been stringing some green beans for about an hour when he stood up he had pain in his right hip as he was trying to work the pain that he had some vertigo-type symptoms with the sensation that his head was spinning he said he sat down with him last about a minute. He does say he had a CVA about a year ago with the same type of symptoms of the dizziness. He denies any headache. Denies any slurred speech. Denies any facial drooping or weakness in his arms and his legs other than some chronic baseline weakness in both of his lower extremity. He said his hand felt a little numb but the rest of his arm felt okay on the left side. He currently has no symptoms with a normal neurologic exam. He has no complaints of chest pain or shortness of breath to indicate cardiac etiology for his symptoms. Since he does say that his symptoms are similar to his past stroke we'll go ahead and get an MRI however have a low suspicion this was a CVA. It sounds like he had a vasovagal type spell related to the pain in his leg  Rolan Bucco, MD 03/27/12 1750

## 2012-03-27 NOTE — ED Notes (Addendum)
Pt c/o right leg pain starting at 1000 after getting up from 5 hours of snapping green beans; pt sts dizziness at that time also; pt then started having left arm pain; pt with CAD but denies CP; pt with mild grip strength weaker in left arm but no other deficits noted

## 2012-03-27 NOTE — ED Provider Notes (Signed)
History     CSN: [id number]  Arrival date & time 03/27/12  1457   First MD Initiated Contact with Patient 03/27/12 1543      Chief Complaint  Patient presents with  . Leg Pain  . Arm Pain  . Dizziness    (Consider location/radiation/quality/duration/timing/severity/associated sxs/prior treatment) Patient is a 78 y.o. male presenting with general illness. The history is provided by the patient.  Illness  The current episode started today. The onset was sudden. The problem occurs frequently. The problem has been resolved. The problem is mild. The symptoms are relieved by rest. Nothing aggravates the symptoms. Pertinent negatives include no fever, no abdominal pain, no nausea, no vomiting, no congestion, no headaches, no rhinorrhea, no sore throat, no neck pain, no cough, no wheezing and no rash. He has been behaving normally. He has been eating and drinking normally. The last void occurred less than 6 hours ago. There were no sick contacts. He has received no recent medical care.    Past Medical History  Diagnosis Date  . CAD (coronary artery disease)   . HTN (hypertension)   . CVA (cerebral infarction)   . Osteoarthritis   . Peptic ulcer disease   . Chronic kidney disease   . DDD (degenerative disc disease), lumbar   . BPH (benign prostatic hyperplasia)   . Insomnia   . Stroke     Past Surgical History  Procedure Date  . Coronary angioplasty with stent placement   . Vagotomy and pyloroplasty   . Excision of right palm dypuytrens contracture   . S/p decompressive laminectomy,medial facetectomy and forminotomy l4-l5   . Great toe chilectomy left   . Hemoclipped gastric ulcer 2009  . Lumbar epidural steroid injection l2-3 2010    History reviewed. No pertinent family history.  History  Substance Use Topics  . Smoking status: Former Smoker    Quit date: 07/20/1954  . Smokeless tobacco: Not on file  . Alcohol Use: No      Review of Systems  Constitutional:  Negative for fever, activity change, appetite change and fatigue.  HENT: Negative for congestion, sore throat, facial swelling, rhinorrhea, trouble swallowing, neck pain, neck stiffness, voice change and sinus pressure.   Eyes: Negative.   Respiratory: Negative for cough, choking, chest tightness, shortness of breath and wheezing.   Cardiovascular: Negative for chest pain.  Gastrointestinal: Negative for nausea, vomiting and abdominal pain.  Genitourinary: Negative for dysuria, urgency, frequency, hematuria, flank pain and difficulty urinating.  Musculoskeletal: Negative for back pain and gait problem.  Skin: Negative for rash and wound.  Neurological: Positive for dizziness and weakness. Negative for facial asymmetry, numbness and headaches.  Psychiatric/Behavioral: Negative for behavioral problems, confusion and agitation. The patient is not nervous/anxious and is not hyperactive.   All other systems reviewed and are negative.    Allergies  Penicillins; Allopurinol; and Benicar  Home Medications   Current Outpatient Rx  Name Route Sig Dispense Refill  . AMLODIPINE BESYLATE 5 MG PO TABS Oral Take 1 tablet (5 mg total) by mouth daily. 30 tablet 11  . ASPIRIN EC 81 MG PO TBEC Oral Take 81 mg by mouth daily.    . ATENOLOL 50 MG PO TABS Oral Take 50 mg by mouth daily.      Marland Kitchen VITAMIN D 1000 UNITS PO TABS Oral Take 1,000 Units by mouth daily.     Marland Kitchen CLOPIDOGREL BISULFATE 75 MG PO TABS Oral Take 1 tablet (75 mg total) by mouth daily. 30 tablet  11  . COQ-10 100 MG PO CAPS Oral Take 1 capsule by mouth daily.    . ISOSORBIDE MONONITRATE ER 120 MG PO TB24 Oral Take 120 mg by mouth daily.    Marland Kitchen LEVOTHYROXINE SODIUM 75 MCG PO TABS Oral Take 75 mcg by mouth daily.     Marland Kitchen NITROGLYCERIN 0.4 MG SL SUBL Sublingual Place 0.4 mg under the tongue every 5 (five) minutes as needed. For chest pain    . OMEPRAZOLE 20 MG PO CPDR Oral Take 20 mg by mouth daily.      Marland Kitchen SIMVASTATIN 20 MG PO TABS Oral Take 1 tablet  (20 mg total) by mouth daily at 6 PM. 30 tablet 11  . TAMSULOSIN HCL 0.4 MG PO CAPS Oral Take 0.4 mg by mouth at bedtime.     Marland Kitchen VITAMIN B-12 1000 MCG PO TABS Oral Take 1,000 mcg by mouth daily.      . COLCHICINE 0.6 MG PO TABS Oral Take 0.6 mg by mouth 2 (two) times daily as needed. For gout    . FUROSEMIDE 40 MG PO TABS Oral Take 40 mg by mouth daily as needed. For fluid      BP 102/51  Pulse 60  Temp 97.7 F (36.5 C) (Oral)  Resp 20  SpO2 97%  Physical Exam  Nursing note and vitals reviewed. Constitutional: He is oriented to person, place, and time. He appears well-developed and well-nourished. No distress.  HENT:  Head: Normocephalic and atraumatic.  Right Ear: External ear normal.  Left Ear: External ear normal.  Mouth/Throat: No oropharyngeal exudate.  Eyes: Conjunctivae and EOM are normal. Pupils are equal, round, and reactive to light. Right eye exhibits no discharge. Left eye exhibits no discharge.  Neck: Normal range of motion. Neck supple. No JVD present. No tracheal deviation present. No thyromegaly present.  Cardiovascular: Normal rate, regular rhythm, normal heart sounds and intact distal pulses.  Exam reveals no gallop and no friction rub.   No murmur heard. Pulmonary/Chest: Effort normal and breath sounds normal. No respiratory distress. He has no wheezes. He exhibits no tenderness.  Abdominal: Soft. Bowel sounds are normal. He exhibits no distension. There is no tenderness. There is no rebound and no guarding.  Musculoskeletal: Normal range of motion. He exhibits no edema and no tenderness.  Lymphadenopathy:    He has no cervical adenopathy.  Neurological: He is alert and oriented to person, place, and time. He has normal reflexes. No cranial nerve deficit or sensory deficit. GCS eye subscore is 4. GCS verbal subscore is 5. GCS motor subscore is 6.       Patient with normal finger to nose and heel to shin tests. No nystagmus or truncal ataxia. Patient four out of 5  strength in bilateral legs. Patient says his legs have been weak for many months and this is baseline for him.  Skin: Skin is warm and dry. No rash noted. He is not diaphoretic. No pallor.  Psychiatric: He has a normal mood and affect. His behavior is normal.    ED Course  Procedures (including critical care time)   Labs Reviewed  CBC  POCT I-STAT TROPONIN I  BASIC METABOLIC PANEL   No results found.   No diagnosis found.    MDM  77 year old male patient with past medical history of coronary artery disease and remote stroke presents with one episode of dizziness and leg weakness. Patient says he was in his normal state of health this morning when he was stepping green  beans for 1 hour and had acute onset dizziness sensation like the room was spinning and he felt his vision blacked out and fell his legs get weaker and some pain in his left hand. The symptoms lasted for 4 hours and then resolved in the emergency department. Exam is normal as above with no nystagmus truncal ataxia or cerebellar symptoms. No cranial nerve deficits or neurological deficits. No chest pain shortness of breath or abdominal pain. No headache either. Exam is normal male patient is asymptomatic. Patient emphatically states that this is how he presented stroke in the past though. Given the patient's extensive past medical history and history of stroke and similarity previous stroke symptoms we'll get MRI of head. EKG is unchanged compared to previous EKGs chest x-ray is normal initial troponin is negative and labs are otherwise reassuring. Patient is comfortable at this point in time afebrile vital signs stable asymptomatic.  Results for orders placed during the hospital encounter of 03/27/12  CBC      Component Value Range   WBC 5.6  4.0 - 10.5 K/uL   RBC 4.29  4.22 - 5.81 MIL/uL   Hemoglobin 13.3  13.0 - 17.0 g/dL   HCT 16.1  09.6 - 04.5 %   MCV 91.8  78.0 - 100.0 fL   MCH 31.0  26.0 - 34.0 pg   MCHC 33.8   30.0 - 36.0 g/dL   RDW 40.9  81.1 - 91.4 %   Platelets 170  150 - 400 K/uL  BASIC METABOLIC PANEL      Component Value Range   Sodium 137  135 - 145 mEq/L   Potassium 5.0  3.5 - 5.1 mEq/L   Chloride 98  96 - 112 mEq/L   CO2 28  19 - 32 mEq/L   Glucose, Bld 196 (*) 70 - 99 mg/dL   BUN 39 (*) 6 - 23 mg/dL   Creatinine, Ser 7.82 (*) 0.50 - 1.35 mg/dL   Calcium 9.3  8.4 - 95.6 mg/dL   GFR calc non Af Amer 19 (*) >90 mL/min   GFR calc Af Amer 22 (*) >90 mL/min  POCT I-STAT TROPONIN I      Component Value Range   Troponin i, poc 0.01  0.00 - 0.08 ng/mL   Comment 3            MR Brain Wo Contrast (Final result)   Result time:03/27/12 1902    Final result by Rad Results In Interface (03/27/12 19:02:25)    Narrative:   *RADIOLOGY REPORT*  Clinical Data: Leg weakness. Dizziness. Vertigo. Left hand numbness. Similar to previous stroke symptoms.  MRI HEAD WITHOUT CONTRAST  Technique: Multiplanar, multiecho pulse sequences of the brain and surrounding structures were obtained according to standard protocol without intravenous contrast.  Comparison: 09/14/2005 MR.  Findings: No acute infarct.  Remote moderate sized infarct inferior right cerebellum with encephalomalacia.  Remote small mid centrum semiovale infarct.  Small vessel disease type changes.  No intracranial hemorrhage.  No intracranial mass lesion detected on this unenhanced exam.  Global atrophy without hydrocephalus.  Abnormal appearance of the right vertebral artery. This was noted previously.  Upper cervical spine degenerative changes with mild spinal stenosis. Minimal cord flattening C4-5 level.  IMPRESSION: No acute infarct. Please see above.  Original Report Authenticated By: Fuller Canada, M.D.            DG Chest 2 View (Final result)   Result time:03/27/12 1640    Final result by Rad  Results In Interface (03/27/12 16:40:04)    Narrative:   *RADIOLOGY REPORT*  Clinical Data: History of  hypertension. History of dizziness. Pain and weakness and right leg.  CHEST - 2 VIEW  Comparison: 02/18/2012 study.  Findings: There is moderate cardiac silhouette enlargement. Ectasia and nonaneurysmal calcification of the thoracic aorta are seen. There is slight increase in perihilar markings with minimal vascular congestion pattern. No consolidation or pleural effusion is seen. There is chronic elevation of the right hemidiaphragm. Small calcified granuloma is seen on the right without evidence of active granulomatous process. Osteophytes are present in the spine. Surgical clips are seen in the upper abdomen.  IMPRESSION: Moderate cardiac silhouette enlargement. Minimal vascular congestion pattern with slight increase in perihilar markings and central peribronchial thickening. No alveolar pulmonary edema, consolidation, or pleural effusion is seen. Chronic elevation of right hemidiaphragm.  Original Report Authenticated By: Crawford Givens, M.D.        Date: 03/28/2012  Rate: 52  Rhythm: sinus bradycardia  QRS Axis: normal  Intervals: normal  ST/T Wave abnormalities: Patient with similar ST segments when compared to EKG done 1 month ago on the lateral leads. Does not appear to represent MI with no chest pain no reciprocal changes could reprresent early repolarization.   Conduction Disutrbances:first-degree A-V block   Narrative Interpretation:   Old EKG Reviewed: unchanged    Patient continues to be asymptomatic MRI is negative. Doubt posterior infarct. We'll have patient followup with PCP.  Case discussed with Dr. Christie Nottingham, MD 03/28/12 [phone]

## 2012-03-27 NOTE — ED Notes (Signed)
Return from xray pt denies complaints at this time.

## 2012-03-27 NOTE — ED Notes (Signed)
Patient was medicated for pain to rt leg as ordered. Will continue to monitor.

## 2012-03-27 NOTE — ED Notes (Signed)
Patient stated that he started feeling pain to his rt leg this morning at 1000 with dizziness like he was going to pass out. Pt denies having any hemeparesis, no chest pain, no shortness of breath. Patient is A/A/Ox4, skin is warm and dry, respiration is even and unlabored.

## 2012-03-28 NOTE — ED Provider Notes (Signed)
I saw and evaluated the patient, reviewed the resident's note and I agree with the findings and plan.   Lavora Brisbon, MD 03/28/12 1510 

## 2012-04-16 ENCOUNTER — Other Ambulatory Visit: Payer: Self-pay | Admitting: *Deleted

## 2012-04-16 DIAGNOSIS — R0789 Other chest pain: Secondary | ICD-10-CM

## 2012-04-23 ENCOUNTER — Ambulatory Visit
Admission: RE | Admit: 2012-04-23 | Discharge: 2012-04-23 | Disposition: A | Discharge status: Home / Self Care | Payer: Medicare Other | Source: Ambulatory Visit | Attending: *Deleted | Admitting: *Deleted

## 2012-04-23 DIAGNOSIS — R0789 Other chest pain: Secondary | ICD-10-CM

## 2012-05-30 ENCOUNTER — Ambulatory Visit
Admission: RE | Admit: 2012-05-30 | Discharge: 2012-05-30 | Disposition: A | Discharge status: Home / Self Care | Payer: Medicare Other | Source: Ambulatory Visit | Attending: Nephrology | Admitting: Nephrology

## 2012-05-30 ENCOUNTER — Other Ambulatory Visit: Payer: Self-pay | Admitting: Nephrology

## 2012-05-30 DIAGNOSIS — N189 Chronic kidney disease, unspecified: Secondary | ICD-10-CM

## 2012-08-02 ENCOUNTER — Emergency Department (HOSPITAL_COMMUNITY)
Admission: EM | Admit: 2012-08-02 | Discharge: 2012-08-03 | Disposition: A | Discharge status: Home / Self Care | Payer: Medicare Other | Attending: Emergency Medicine | Admitting: Emergency Medicine

## 2012-08-02 DIAGNOSIS — K439 Ventral hernia without obstruction or gangrene: Secondary | ICD-10-CM | POA: Insufficient documentation

## 2012-08-02 DIAGNOSIS — Z87891 Personal history of nicotine dependence: Secondary | ICD-10-CM | POA: Insufficient documentation

## 2012-08-02 DIAGNOSIS — I129 Hypertensive chronic kidney disease with stage 1 through stage 4 chronic kidney disease, or unspecified chronic kidney disease: Secondary | ICD-10-CM | POA: Insufficient documentation

## 2012-08-02 DIAGNOSIS — I2510 Atherosclerotic heart disease of native coronary artery without angina pectoris: Secondary | ICD-10-CM | POA: Insufficient documentation

## 2012-08-02 DIAGNOSIS — Z8673 Personal history of transient ischemic attack (TIA), and cerebral infarction without residual deficits: Secondary | ICD-10-CM | POA: Insufficient documentation

## 2012-08-02 DIAGNOSIS — Z9861 Coronary angioplasty status: Secondary | ICD-10-CM | POA: Insufficient documentation

## 2012-08-02 DIAGNOSIS — Z8711 Personal history of peptic ulcer disease: Secondary | ICD-10-CM | POA: Insufficient documentation

## 2012-08-02 DIAGNOSIS — Z7982 Long term (current) use of aspirin: Secondary | ICD-10-CM | POA: Insufficient documentation

## 2012-08-02 DIAGNOSIS — Z8739 Personal history of other diseases of the musculoskeletal system and connective tissue: Secondary | ICD-10-CM | POA: Insufficient documentation

## 2012-08-02 DIAGNOSIS — Z79899 Other long term (current) drug therapy: Secondary | ICD-10-CM | POA: Insufficient documentation

## 2012-08-02 DIAGNOSIS — N400 Enlarged prostate without lower urinary tract symptoms: Secondary | ICD-10-CM | POA: Insufficient documentation

## 2012-08-02 DIAGNOSIS — N189 Chronic kidney disease, unspecified: Secondary | ICD-10-CM | POA: Insufficient documentation

## 2012-08-02 LAB — CBC WITH DIFFERENTIAL/PLATELET
Basophils Absolute: 0 10*3/uL (ref 0.0–0.1)
Basophils Relative: 0 % (ref 0–1)
Eosinophils Absolute: 0.1 10*3/uL (ref 0.0–0.7)
Hemoglobin: 9.1 g/dL — ABNORMAL LOW (ref 13.0–17.0)
MCH: 30.1 pg (ref 26.0–34.0)
MCHC: 32.2 g/dL (ref 30.0–36.0)
Monocytes Absolute: 0.6 10*3/uL (ref 0.1–1.0)
Monocytes Relative: 12 % (ref 3–12)
Neutrophils Relative %: 74 % (ref 43–77)
RDW: 14.5 % (ref 11.5–15.5)

## 2012-08-02 LAB — COMPREHENSIVE METABOLIC PANEL
Albumin: 3.1 g/dL — ABNORMAL LOW (ref 3.5–5.2)
BUN: 42 mg/dL — ABNORMAL HIGH (ref 6–23)
Creatinine, Ser: 2.42 mg/dL — ABNORMAL HIGH (ref 0.50–1.35)
Total Protein: 5.9 g/dL — ABNORMAL LOW (ref 6.0–8.3)

## 2012-08-02 LAB — GLUCOSE, CAPILLARY: Glucose-Capillary: 86 mg/dL (ref 70–99)

## 2012-08-02 LAB — LIPASE, BLOOD: Lipase: 29 U/L (ref 11–59)

## 2012-08-02 MED ORDER — DEXTROSE 50 % IV SOLN: 1.0000 | Freq: Once | INTRAVENOUS | Status: DC

## 2012-08-02 MED ORDER — IOHEXOL 300 MG/ML  SOLN
20.0000 mL | INTRAMUSCULAR | Status: AC
  Administered 2012-08-02: 20 mL via ORAL

## 2012-08-02 NOTE — ED Notes (Signed)
Pt resting quietly with visitor at bedside, pt denies any complaints at this time. Plan of care is updated with verbal understanding. Pt is drinking contrast and awaiting CT testing, will continue to monitor pt.

## 2012-08-02 NOTE — ED Provider Notes (Signed)
History     CSN: [id number]  Arrival date & time 08/02/12  2107   First MD Initiated Contact with Patient 08/02/12 2116      Chief Complaint  Patient presents with  . Abdominal Pain    (Consider location/radiation/quality/duration/timing/severity/associated sxs/prior treatment) HPI Comments: The patient has had intermittent midline upper abdominal pain for 4 weeks that is gradually been getting worse. He states he feels a bulge in his upper mid abdomen where his old incision is from the surgery he had 30 or 40 years ago per the patient. He says it is worse when he strains and when he is getting up from a laying or sitting position he says the pain increases in the bulge increases in size. He has been evaluated by his primary care doctor for this and was referred to Gen. surgery and has appointment December 2. He states he came in today because the pain got worse and he wants the surgery done sooner.  Patient is a 78 y.o. male presenting with abdominal pain. The history is provided by the patient. No language interpreter was used.  Abdominal Pain The primary symptoms of the illness include abdominal pain. The primary symptoms of the illness do not include fever, fatigue, shortness of breath, nausea, vomiting, diarrhea, hematemesis, hematochezia or dysuria. The current episode started more than 2 days ago. The onset of the illness was gradual. The problem has been gradually worsening.  The abdominal pain began more than 2 days ago. The pain came on gradually. The abdominal pain has been gradually worsening since its onset. The abdominal pain is located in the epigastric region and periumbilical region. The abdominal pain does not radiate. The severity of the abdominal pain is 4/10. The abdominal pain is relieved by certain positions. The abdominal pain is exacerbated by certain positions.  The patient has not had a change in bowel habit. Risk factors for an acute abdominal problem include a  history of abdominal surgery. Symptoms associated with the illness do not include chills, anorexia, diaphoresis, constipation or back pain. Significant associated medical issues include PUD.    Past Medical History  Diagnosis Date  . CAD (coronary artery disease)   . HTN (hypertension)   . CVA (cerebral infarction)   . Osteoarthritis   . Peptic ulcer disease   . Chronic kidney disease   . DDD (degenerative disc disease), lumbar   . BPH (benign prostatic hyperplasia)   . Insomnia   . Stroke     Past Surgical History  Procedure Date  . Coronary angioplasty with stent placement   . Vagotomy and pyloroplasty   . Excision of right palm dypuytrens contracture   . S/p decompressive laminectomy,medial facetectomy and forminotomy l4-l5   . Great toe chilectomy left   . Hemoclipped gastric ulcer 2009  . Lumbar epidural steroid injection l2-3 2010    No family history on file.  History  Substance Use Topics  . Smoking status: Former Smoker    Quit date: 07/20/1954  . Smokeless tobacco: Not on file  . Alcohol Use: No      Review of Systems  Constitutional: Negative for fever, chills, diaphoresis, activity change, appetite change and fatigue.  HENT: Negative for sore throat and neck pain.   Eyes: Negative for discharge and visual disturbance.  Respiratory: Negative for cough, choking and shortness of breath.   Cardiovascular: Negative for chest pain and leg swelling.  Gastrointestinal: Positive for abdominal pain. Negative for nausea, vomiting, diarrhea, constipation, hematochezia, anorexia and hematemesis.  Genitourinary: Negative for dysuria and difficulty urinating.  Musculoskeletal: Negative for back pain and arthralgias.  Skin: Negative for color change and pallor.  Neurological: Negative for dizziness, speech difficulty and light-headedness.  Psychiatric/Behavioral: Negative for behavioral problems and agitation.  All other systems reviewed and are  negative.    Allergies  Penicillins; Allopurinol; and Benicar  Home Medications   Current Outpatient Rx  Name  Route  Sig  Dispense  Refill  . AMLODIPINE BESYLATE 5 MG PO TABS   Oral   Take 1 tablet (5 mg total) by mouth daily.   30 tablet   11   . ASPIRIN EC 81 MG PO TBEC   Oral   Take 81 mg by mouth daily.         . ATENOLOL 50 MG PO TABS   Oral   Take 50 mg by mouth daily.           Marland Kitchen VITAMIN D 1000 UNITS PO TABS   Oral   Take 1,000 Units by mouth daily.          . COQ-10 100 MG PO CAPS   Oral   Take 1 capsule by mouth daily.         . FUROSEMIDE 40 MG PO TABS   Oral   Take 40 mg by mouth daily as needed. For fluid         . ISOSORBIDE MONONITRATE ER 120 MG PO TB24   Oral   Take 120 mg by mouth daily.         Marland Kitchen LEVOTHYROXINE SODIUM 75 MCG PO TABS   Oral   Take 75 mcg by mouth daily.          Marland Kitchen NITROGLYCERIN 0.4 MG SL SUBL   Sublingual   Place 0.4 mg under the tongue every 5 (five) minutes as needed. For chest pain         . OMEPRAZOLE 20 MG PO CPDR   Oral   Take 20 mg by mouth daily.           Marland Kitchen SIMVASTATIN 20 MG PO TABS   Oral   Take 1 tablet (20 mg total) by mouth daily at 6 PM.   30 tablet   11   . TAMSULOSIN HCL 0.4 MG PO CAPS   Oral   Take 0.4 mg by mouth at bedtime.          Marland Kitchen VITAMIN B-12 1000 MCG PO TABS   Oral   Take 1,000 mcg by mouth daily.             BP 125/52  Pulse 78  Temp 98 F (36.7 C) (Oral)  Resp 16  SpO2 97%  Physical Exam  Constitutional: He appears well-developed. No distress.  HENT:  Head: Normocephalic and atraumatic.  Mouth/Throat: No oropharyngeal exudate.  Eyes: EOM are normal. Pupils are equal, round, and reactive to light. Right eye exhibits no discharge. Left eye exhibits no discharge.  Neck: Normal range of motion. Neck supple. No JVD present.  Cardiovascular: Normal rate, regular rhythm and normal heart sounds.   Pulmonary/Chest: Effort normal and breath sounds normal. No  stridor. No respiratory distress. He has no wheezes. He has no rales. He exhibits no tenderness.  Abdominal: Soft. Bowel sounds are normal. He exhibits mass. There is tenderness (midline above umbilicus). There is no guarding.       Bulging mass present above the umbilicus at the midline that is tender. It increases in size when  the patient performs also lower raises his head off the bed.  Genitourinary: Penis normal. Guaiac negative stool.  Musculoskeletal: Normal range of motion. He exhibits no edema and no tenderness.  Neurological: He is alert. No cranial nerve deficit. He exhibits normal muscle tone.  Skin: Skin is warm and dry. He is not diaphoretic. No erythema. No pallor.  Psychiatric: He has a normal mood and affect. His behavior is normal. Judgment and thought content normal.    ED Course  Procedures (including critical care time)  Labs Reviewed  CBC WITH DIFFERENTIAL - Abnormal; Notable for the following:    RBC 3.02 (*)     Hemoglobin 9.1 (*)     HCT 28.3 (*)     Lymphs Abs 0.6 (*)     All other components within normal limits  COMPREHENSIVE METABOLIC PANEL - Abnormal; Notable for the following:    Glucose, Bld 57 (*)     BUN 42 (*)     Creatinine, Ser 2.42 (*)     Total Protein 5.9 (*)     Albumin 3.1 (*)     GFR calc non Af Amer 22 (*)     GFR calc Af Amer 26 (*)     All other components within normal limits  LIPASE, BLOOD  GLUCOSE, CAPILLARY   No results found.   1. Ventral hernia       MDM  9:38 PM exam consistent with a ventral incisional hernia. No signs of strangulation or obstruction. The patient has been tolerating by mouth normally and has not had any nausea or vomiting. He does have a significant amount of tenderness over that area so I will get a CT scan without contrast to further evaluate the hernia.  10:32 PM patient stable doing well, and anemia noted. Heme-negative on rectal.  12:33 AM stable awiating CT. Dr Freida Busman to f/u  results.      Warrick Parisian, MD 08/03/12 [phone]

## 2012-08-02 NOTE — ED Notes (Signed)
Pt is from home with complaints of mid abdominal pain radiating to back x 1 month. Pt had unknown previous abdominal surgery scar per EMS.  Pt denies any n/v/d, pt reports increase pain with walking.

## 2012-08-03 ENCOUNTER — Encounter (HOSPITAL_COMMUNITY): Payer: Self-pay | Admitting: Radiology

## 2012-08-03 ENCOUNTER — Emergency Department (HOSPITAL_COMMUNITY): Payer: Medicare Other

## 2012-08-03 NOTE — ED Notes (Signed)
Patient transported to CT 

## 2012-08-03 NOTE — ED Notes (Signed)
Pt denies any further questions, verbalized understanding of f/u instructions upon discharge.

## 2012-08-03 NOTE — ED Provider Notes (Signed)
Pt signed out to me and abd ct neg for obstruction  Maxwell Baker, MD 08/03/12 [phone]

## 2012-08-06 NOTE — ED Provider Notes (Signed)
I saw and evaluated the patient, reviewed the resident's note and I agree with the findings and plan.  Pt with ventral hernia on exam.  Soft, doubt incarceration.  CT scan ordered.  Dr Freida Busman to follow up on study.  Celene Kras, MD 08/06/12 1600

## 2012-08-07 ENCOUNTER — Ambulatory Visit (INDEPENDENT_AMBULATORY_CARE_PROVIDER_SITE_OTHER): Payer: Medicare Other | Admitting: Surgery

## 2012-08-07 ENCOUNTER — Encounter (INDEPENDENT_AMBULATORY_CARE_PROVIDER_SITE_OTHER): Payer: Self-pay | Admitting: Surgery

## 2012-08-07 VITALS — BP 122/62 | HR 64 | Temp 97.0°F | Resp 16 | Ht 68.0 in | Wt 158.0 lb

## 2012-08-07 DIAGNOSIS — K432 Incisional hernia without obstruction or gangrene: Secondary | ICD-10-CM

## 2012-08-07 MED ORDER — HYDROCODONE-ACETAMINOPHEN 10-325 MG PO TABS: 1.0000 | ORAL_TABLET | Freq: Four times a day (QID) | ORAL | Status: DC | PRN

## 2012-08-07 NOTE — Patient Instructions (Signed)
Hernia, Surgical Repair A hernia occurs when an internal organ pushes out through a weak spot in the belly (abdominal) wall muscles. Hernias commonly occur in the groin and around the navel. Hernias often can be pushed back into place (reduced). Most hernias tend to get worse over time. Problems occur when abdominal contents get stuck in the opening (incarcerated hernia). The blood supply gets cut off (strangulated hernia). This is an emergency and needs surgery. Otherwise, hernia repair can be an elective procedure. This means you can schedule this at your convenience when an emergency is not present. Because complications can occur, if you decide to repair the hernia, it is best to do it soon. When it becomes an emergency procedure, there is increased risk of complications after surgery. CAUSES   Heavy lifting.  Obesity.  Prolonged coughing.  Straining to move your bowels.  Hernias can also occur through a cut (incision) by a surgeonafter an abdominal operation. HOME CARE INSTRUCTIONS Before the repair:  Bed rest is not required. You may continue your normal activities, but avoid heavy lifting (more than 10 pounds) or straining. Cough gently. If you are a smoker, it is best to stop. Even the best hernia repair can break down with the continual strain of coughing.  Do not wear anything tight over your hernia. Do not try to keep it in with an outside bandage or truss. These can damage abdominal contents if they are trapped in the hernia sac.  Eat a normal diet. Avoid constipation. Straining over long periods of time to have a bowel movement will increase hernia size. It also can breakdown repairs. If you cannot do this with diet alone, laxatives or stool softeners may be used. PRIOR TO SURGERY, SEEK IMMEDIATE MEDICAL CARE IF: You have problems (symptoms) of a trapped (incarcerated) hernia. Symptoms include:  An oral temperature above 102 F (38.9 C) develops, or as your caregiver  suggests.  Increasing abdominal pain.  Feeling sick to your stomach(nausea) and vomiting.  You stop passing gas or stool.  The hernia is stuck outside the abdomen, looks discolored, feels hard, or is tender.  You have any changes in your bowel habits or in the hernia that is unusual for you. LET YOUR CAREGIVERS KNOW ABOUT THE FOLLOWING:  Allergies.  Medications taken including herbs, eye drops, over the counter medications, and creams.  Use of steroids (by mouth or creams).  Family or personal history of problems with anesthetics or Novocaine.  Possibility of pregnancy, if this applies.  Personal history of blood clots (thrombophlebitis).  Family or personal history of bleeding or blood problems.  Previous surgery.  Other health problems. BEFORE THE PROCEDURE You should be present 1 hour prior to your procedure, or as directed by your caregiver.  AFTER THE PROCEDURE After surgery, you will be taken to the recovery area. A nurse will watch and check your progress there. Once you are awake, stable, and taking fluids well, you will be allowed to go home as long as there are no problems. Once home, an ice pack (wrapped in a light towel) applied to your operative site may help with discomfort. It may also keep the swelling down. Do not lift anything heavier than 10 pounds (4.55 kilograms). Take showers not baths. Do not drive while taking narcotics. Follow instructions as suggested by your caregiver.  SEEK IMMEDIATE MEDICAL CARE IF: After surgery:  There is redness, swelling, or increasing pain in the wound.  There is pus coming from the wound.  There is   drainage from a wound lasting longer than 1 day.  An unexplained oral temperature above 102 F (38.9 C) develops.  You notice a foul smell coming from the wound or dressing.  There is a breaking open of a wound (edged not staying together) after the sutures have been removed.  You notice increasing pain in the shoulders  (shoulder strap areas).  You develop dizzy episodes or fainting while standing.  You develop persistent nausea or vomiting.  You develop a rash.  You have difficulty breathing.  You develop any reaction or side effects to medications given. MAKE SURE YOU:   Understand these instructions.  Will watch your condition.  Will get help right away if you are not doing well or get worse. Document Released: 03/01/2001 Document Revised: 11/28/2011 Document Reviewed: 01/22/2008 Exit[employer] Patient Information 2013 [employer]. Hernia A hernia occurs when an internal organ pushes out through a weak spot in the abdominal wall. Hernias most commonly occur in the groin and around the navel. Hernias often can be pushed back into place (reduced). Most hernias tend to get worse over time. Some abdominal hernias can get stuck in the opening (irreducible or incarcerated hernia) and cannot be reduced. An irreducible abdominal hernia which is tightly squeezed into the opening is at risk for impaired blood supply (strangulated hernia). A strangulated hernia is a medical emergency. Because of the risk for an irreducible or strangulated hernia, surgery may be recommended to repair a hernia. CAUSES   Heavy lifting.  Prolonged coughing.  Straining to have a bowel movement.  A cut (incision) made during an abdominal surgery. HOME CARE INSTRUCTIONS   Bed rest is not required. You may continue your normal activities.  Avoid lifting more than 10 pounds (4.5 kg) or straining.  Cough gently. If you are a smoker it is best to stop. Even the best hernia repair can break down with the continual strain of coughing. Even if you do not have your hernia repaired, a cough will continue to aggravate the problem.  Do not wear anything tight over your hernia. Do not try to keep it in with an outside bandage or truss. These can damage abdominal contents if they are trapped within the hernia sac.  Eat a normal  diet.  Avoid constipation. Straining over long periods of time will increase hernia size and encourage breakdown of repairs. If you cannot do this with diet alone, stool softeners may be used. SEEK IMMEDIATE MEDICAL CARE IF:   You have a fever.  You develop increasing abdominal pain.  You feel nauseous or vomit.  Your hernia is stuck outside the abdomen, looks discolored, feels hard, or is tender.  You have any changes in your bowel habits or in the hernia that are unusual for you.  You have increased pain or swelling around the hernia.  You cannot push the hernia back in place by applying gentle pressure while lying down. MAKE SURE YOU:   Understand these instructions.  Will watch your condition.  Will get help right away if you are not doing well or get worse. Document Released: 09/05/2005 Document Revised: 11/28/2011 Document Reviewed: 04/24/2008 Exit[employer] Patient Information 2013 [employer].  

## 2012-08-07 NOTE — Progress Notes (Signed)
Patient ID: [name], male   DOB: 23-Jul-1923, 78 y.o.   MRN: [id number]  Chief Complaint  Patient presents with  . Incisional Hernia    eval hernia    HPI [name] is a 78 y.o. male.  Patient sent at the request of Dr. Kevan Ny for incisional hernia. The patient has had a painful bulge just above his umbilicus for a number of months. He has been seen once in the emergency room for it were a CT scan was done which shows a 3 cm supraumbilical hernia with fat. He's had a previous operation for ulcer disease in 1980s. He has intermittent pain with this hernia and the pain is severe and sharp at times made worse with eating or exertion. He has significant heart and kidney disease. These seem to be stable. The bulging his abdomen is tender it does slide in and out. HPI  Past Medical History  Diagnosis Date  . CAD (coronary artery disease)   . HTN (hypertension)   . CVA (cerebral infarction)   . Osteoarthritis   . Peptic ulcer disease   . Chronic kidney disease   . DDD (degenerative disc disease), lumbar   . BPH (benign prostatic hyperplasia)   . Insomnia   . Stroke   . GERD (gastroesophageal reflux disease)   . Cataract   . CHF (congestive heart failure)   . Hyperlipidemia   . Thyroid disease     Past Surgical History  Procedure Date  . Coronary angioplasty with stent placement   . Vagotomy and pyloroplasty   . Excision of right palm dypuytrens contracture   . S/p decompressive laminectomy,medial facetectomy and forminotomy l4-l5   . Great toe chilectomy left   . Hemoclipped gastric ulcer 2009  . Lumbar epidural steroid injection l2-3 2010    No family history on file.  Social History History  Substance Use Topics  . Smoking status: Former Smoker    Quit date: 07/20/1954  . Smokeless tobacco: Not on file  . Alcohol Use: No    Allergies  Allergen Reactions  . Penicillins Hives  . Allopurinol Other (See Comments)    Unknown   . Benicar (Olmesartan  Medoxomil) Other (See Comments)    unknown    Current Outpatient Prescriptions  Medication Sig Dispense Refill  . aspirin EC 81 MG tablet Take 81 mg by mouth daily.      Marland Kitchen atenolol (TENORMIN) 50 MG tablet Take 50 mg by mouth every evening.       . cholecalciferol (VITAMIN D) 1000 UNITS tablet Take 1,000 Units by mouth daily.       . clopidogrel (PLAVIX) 75 MG tablet Take 75 mg by mouth daily.      . Coenzyme Q10 (COQ-10) 100 MG CAPS Take 1 capsule by mouth daily.      . colchicine 0.6 MG tablet Take 0.6 mg by mouth daily.      . furosemide (LASIX) 40 MG tablet Take 20-40 mg by mouth See admin instructions. Alternates 1/2 tablet to 1 tablet every other day. Last dose was 20 mg.      . levothyroxine (SYNTHROID, LEVOTHROID) 75 MCG tablet Take 75 mcg by mouth daily.       . metoprolol succinate (TOPROL-XL) 50 MG 24 hr tablet Take 50 mg by mouth every evening. Take with or immediately following a meal.      . nitroGLYCERIN (NITROSTAT) 0.4 MG SL tablet Place 0.4 mg under the tongue every 5 (five) minutes as  needed. For chest pain      . pantoprazole (PROTONIX) 40 MG tablet Take 40 mg by mouth daily.      . simvastatin (ZOCOR) 20 MG tablet Take 1 tablet (20 mg total) by mouth daily at 6 PM.  30 tablet  11  . Tamsulosin HCl (FLOMAX) 0.4 MG CAPS Take 0.4 mg by mouth at bedtime.       Marland Kitchen HYDROcodone-acetaminophen (NORCO) 10-325 MG per tablet Take 1 tablet by mouth every 6 (six) hours as needed for pain.  20 tablet  0    Review of Systems Review of Systems  Constitutional: Positive for activity change. Negative for fatigue.  HENT: Negative.   Eyes: Negative.   Respiratory: Negative.   Cardiovascular: Negative.   Gastrointestinal: Positive for abdominal pain. Negative for nausea.  Genitourinary: Negative.   Musculoskeletal: Negative.   Neurological: Negative.   Hematological: Negative.   Psychiatric/Behavioral: Negative.     Blood pressure 122/62, pulse 64, temperature 97 F (36.1 C), resp.  rate 16, height 5\' 8"  (1.727 m), weight 158 lb (71.668 kg).  Physical Exam Physical Exam  Constitutional: He appears well-developed and well-nourished.  HENT:  Head: Normocephalic and atraumatic.  Eyes: EOM are normal. Pupils are equal, round, and reactive to light.  Neck: Normal range of motion. Neck supple.  Cardiovascular: Normal rate and regular rhythm.   No murmur heard. Pulmonary/Chest: Effort normal and breath sounds normal. No respiratory distress.  Abdominal:      Data Reviewed Clinical Data: Abdominal pain. Evaluate ventral hernia.  CT ABDOMEN AND PELVIS WITHOUT CONTRAST  Technique: Multidetector CT imaging of the abdomen and pelvis was  performed following the standard protocol without intravenous  contrast.  Comparison: None  Findings: Lung bases: Dependent changes identified in both lung  bases. Chronic-appearing interstitial reticulation is also  identified which appears lower lobe predominant. The heart size is  enlarged. There are prominent calcifications involving the LAD and  RCA coronary artery. No pericardial effusion. Calcified lymph node  noted within the right hilar region. There is a calcified  granuloma in the right middle lobe.  Abdomen/pelvis: No focal liver abnormality. Gallbladder appears  normal. No biliary dilatation. The pancreas appears within normal  limits.  The spleen is normal.  Normal appearing bilateral adrenal glands. Mild bilateral renal  atrophy. The kidneys are otherwise unremarkable. There is a  diverticula arising from the right posterior lateral wall of the  bladder. This measures 3.4 x 3.7 cm, image 79. Bladder otherwise  normal. Prostate gland appears enlarged.  No enlarged upper abdominal lymph nodes. No pelvic or inguinal  adenopathy.  Surgical clips identified in the region of the proximal stomach.  The small bowel loops have a normal course and caliber. The  appendix is visualized and appears normal. Multiple descending    colon and sigmoid colon diverticula identified. No evidence for  acute diverticulitis.  No free fluid or fluid collections identified within the abdomen or  pelvis.  Bones/Musculoskeletal: There is a scoliosis deformity which is  convex to the right. Multilevel degenerative disc disease is seen  throughout the lumbar spine. Most severe at L5-S1. Bilateral L5  pars defects are noted and there is a first-degree anterolisthesis  of L5 on S1.  Suture material is identified along the ventral surface of the  abdominal wall fascia. There is a supraumbilical hernia which  contains fat only. The mouth of the hernia measures 2 cm. The  hernia itself measures 5.4 cm width.  IMPRESSION:  1. No  acute findings identified within the abdomen or pelvis.  2. Supraumbilical ventral abdominal wall hernia contains fat only.  3. Descending colon and sigmoid colon diverticulosis.  4. Prominent coronary artery calcifications.  5. Bilateral basilar predominant interstitial reticulation.  6. Prior granulomatous disease.  Original Report Authenticated By: Signa Kell, M.D.    Assessment    Incisional hernia symptomatic Patient Active Problem List  Diagnosis  . CAD (coronary artery disease)  . HTN (hypertension)  . CVA (cerebral infarction)  . Osteoarthritis  . Peptic ulcer disease  . Chronic kidney disease  . DDD (degenerative disc disease), lumbar  . BPH (benign prostatic hyperplasia)  . Insomnia      Plan    Spent 45 min discussing surgery and other treatment options.  High operative risk.  Will ask for medical clearance.  A laparoscopic approach is possible.  The risk of hernia repair include bleeding,  Infection,   Recurrence of the hernia,  Mesh use, chronic pain,  Organ injury,  Bowel injury,  Bladder injury,   nerve injury with numbness around the incision,  Death,  and worsening of preexisting  medical problems.  The alternatives to surgery have been discussed as well..  Long term  expectations of both operative and non operative treatments have been discussed.   The patient agrees to proceed if OK with cardiology and medicine.  Will try laparoscopic possible open incisional hernia repair if cleared.       Ogden Handlin A. 08/07/2012, 9:43 AM

## 2012-08-08 ENCOUNTER — Encounter (INDEPENDENT_AMBULATORY_CARE_PROVIDER_SITE_OTHER): Payer: Self-pay

## 2012-08-10 ENCOUNTER — Other Ambulatory Visit (INDEPENDENT_AMBULATORY_CARE_PROVIDER_SITE_OTHER): Payer: Self-pay | Admitting: Surgery

## 2012-08-20 ENCOUNTER — Ambulatory Visit (INDEPENDENT_AMBULATORY_CARE_PROVIDER_SITE_OTHER): Payer: Self-pay | Admitting: Surgery

## 2012-08-30 ENCOUNTER — Telehealth (INDEPENDENT_AMBULATORY_CARE_PROVIDER_SITE_OTHER): Payer: Self-pay

## 2012-08-30 NOTE — Telephone Encounter (Signed)
I tried returning her phone call. Spoke to her husband and told him pt needed to stop asa and plavix 5 days before surgery. he is hard of hearing so unsure if he got the message fully. He gave me a cell number to reach [place] but it didn't work. Please inform her if she returns my phone call.

## 2012-08-30 NOTE — Telephone Encounter (Signed)
The patient's daughter in law called regarding Plavix.  Surgery is 09/25/12.  Please call to discuss stopping Plavix.  He sees Dr Katrinka Blazing for Cardiology.

## 2012-09-10 ENCOUNTER — Encounter (HOSPITAL_COMMUNITY): Payer: Self-pay | Admitting: Pharmacy Technician

## 2012-09-14 ENCOUNTER — Encounter (HOSPITAL_COMMUNITY)
Admission: RE | Admit: 2012-09-14 | Discharge: 2012-09-14 | Disposition: A | Discharge status: Home / Self Care | Payer: Medicare Other | Source: Ambulatory Visit | Attending: Surgery | Admitting: Surgery

## 2012-09-14 ENCOUNTER — Encounter (HOSPITAL_COMMUNITY): Payer: Self-pay

## 2012-09-14 HISTORY — DX: Personal history of other diseases of the digestive system: Z87.19

## 2012-09-14 HISTORY — DX: Hypothyroidism, unspecified: E03.9

## 2012-09-14 LAB — SURGICAL PCR SCREEN
MRSA, PCR: NEGATIVE
Staphylococcus aureus: NEGATIVE

## 2012-09-14 LAB — COMPREHENSIVE METABOLIC PANEL
Alkaline Phosphatase: 95 U/L (ref 39–117)
BUN: 64 mg/dL — ABNORMAL HIGH (ref 6–23)
Chloride: 100 mEq/L (ref 96–112)
Creatinine, Ser: 3.03 mg/dL — ABNORMAL HIGH (ref 0.50–1.35)
GFR calc Af Amer: 20 mL/min — ABNORMAL LOW (ref >90)
GFR calc non Af Amer: 17 mL/min — ABNORMAL LOW (ref >90)
Glucose, Bld: 76 mg/dL (ref 70–99)
Potassium: 3.7 mEq/L (ref 3.5–5.1)
Total Bilirubin: 0.4 mg/dL (ref 0.3–1.2)

## 2012-09-14 LAB — CBC WITH DIFFERENTIAL/PLATELET
Basophils Relative: 0 % (ref 0–1)
HCT: 33.6 % — ABNORMAL LOW (ref 39.0–52.0)
Hemoglobin: 10.7 g/dL — ABNORMAL LOW (ref 13.0–17.0)
Lymphs Abs: 1.4 10*3/uL (ref 0.7–4.0)
MCH: 29.2 pg (ref 26.0–34.0)
MCHC: 31.8 g/dL (ref 30.0–36.0)
Monocytes Absolute: 0.4 10*3/uL (ref 0.1–1.0)
Monocytes Relative: 7 % (ref 3–12)
Neutro Abs: 3.7 10*3/uL (ref 1.7–7.7)
Neutrophils Relative %: 65 % (ref 43–77)
RBC: 3.67 MIL/uL — ABNORMAL LOW (ref 4.22–5.81)

## 2012-09-14 NOTE — Progress Notes (Signed)
09/14/12 1240  OBSTRUCTIVE SLEEP APNEA  Have you ever been diagnosed with sleep apnea through a sleep study? No  Do you snore loudly (loud enough to be heard through closed doors)?  0  Do you often feel tired, fatigued, or sleepy during the daytime? 1  Has anyone observed you stop breathing during your sleep? 1 (SNORTS AND HAS TO CATCH BREATH )  Do you have, or are you being treated for high blood pressure? 1  BMI more than 35 kg/m2? 0  Age over 68 years old? 1  Neck circumference greater than 40 cm/18 inches? 0 (15 IN)  Gender: 1  Obstructive Sleep Apnea Score 5   Score 4 or greater  Results sent to PCP

## 2012-09-14 NOTE — Progress Notes (Signed)
REQUESTED LAST STRESS TEST, OV, EKG, ECHO,  FROM [facility].  [phone].

## 2012-09-14 NOTE — Progress Notes (Signed)
PATIENT INSTRUCTED TO STOP ASPIRIN AND PLAVIX 5 DAYS PRIOR TO SURGERY PER ORDER . (SAW OFFICE NOTE WHERE SURGEON'S OFFICE SPOKE WITH PATIENT AND INSTRUCTED HIM TO STOP ASPIRIN AND PLAVIX 5 DAYS PRIOR TO SURGERY.

## 2012-09-14 NOTE — Pre-Procedure Instructions (Addendum)
22 [name]  09/14/2012   Your procedure is scheduled on:  TUES  09/25/12  Report to [facility] at 530 AM.  Call this number if you have problems the morning of surgery: [phone]   Remember:   Do not eat food OR DRINK :After Midnight.      Take these medicines the morning of surgery with A SIP OF WATER: ATENOLOL (TENORMIN), COLCHICINE, HYDROCODONE IF NEEDED, SYNTHROID, PROTONIX, FLOMAX (STOP ASPIRIN, PLAVIX, EFFIENT, HERBAL MEDICINES)   Do not wear jewelry, make-up or nail polish.  Do not wear lotions, powders, or perfumes. You may wear deodorant.  Do not shave 48 hours prior to surgery. Men may shave face and neck.  Do not bring valuables to the hospital.  Contacts, dentures or bridgework may not be worn into surgery.  Leave suitcase in the car. After surgery it may be brought to your room.  For patients admitted to the hospital, checkout time is 11:00 AM the day of discharge.   Patients discharged the day of surgery will not be allowed to drive home.  Name and phone number of your driver:   Special Instructions: Shower using CHG 2 nights before surgery and the night before surgery.  If you shower the day of surgery use CHG.  Use special wash - you have one bottle of CHG for all showers.  You should use approximately 1/3 of the bottle for each shower.   Please read over the following fact sheets that you were given: Pain Booklet, Coughing and Deep Breathing, MRSA Information and Surgical Site Infection Prevention

## 2012-09-17 NOTE — Progress Notes (Signed)
Received copies of OV note, stress test, ECHO, and EKG. Placed in chart.  Given to A. Zelenak,PA, to review.

## 2012-09-18 ENCOUNTER — Telehealth (INDEPENDENT_AMBULATORY_CARE_PROVIDER_SITE_OTHER): Payer: Self-pay

## 2012-09-18 NOTE — Consult Note (Addendum)
Anesthesia Chart Review:  Patient is a 78 year old male scheduled for laparoscopic incisional hernia repair by Dr. Luisa Hart on 09/25/12.  History includes former smoker, CAD s/p mid and proximal RCA stents X 4 '99, HTN, CVA, diastolic CHF, CKD stage IV (Dr. Allena Katz), OA, GERD, HLD, PUD, hiatal hernia, hypothyroidism, BPH, DDD.  His OSA screening score was a 5.  PCP is Dr. Johnella Moloney who was notified of planned procedure and deferred clearance to cardiologist.  Cardiologist is Dr. Verdis Prime, last visit 07/30/12. He was notified of planned procedure. He cleared patient for noncardiac surgery but at moderate to high risk for CV complication due to CV status, renal function, and age.  He recommended, "...prudent clinical judgment and patient/family discussion to align expectations..."  (See his notation from 08/08/12.)  Cardiac cath on 08/04/11 showed normal left main, LAD heavily calcified with diffuse 80% narrowing in the mid segment, ostial 80% in the first and second diagonal branches.  Left circumflex with moderate calcifications and 50-70% stenosis. RCA is calcified and contains overlapping stents in the proximal and mid segment.  There is > 80% stenosis in the ostium of the RCA. Left ventriculography was not performed due to renal insufficiency.  PCI on the ostium of his RCA +/- atherectomy was considered.  He was also seen by [facility] for surgical back-up and was felt to be high risk for CABG in the event of PCI complication due to his co-morbidities and age. It appears that ultimately continued medical therapy was recommended.  Nuclear stress test on 12/14/09 showed normal myocardial perfusion, EF 64%.  His last echo was on 02/11/04 and showed a normal EF, trivial MR/TR.  His last EKG on 03/27/12 showed SB, first degree AVB, lateral injury pattern, consider acute STEMI.  Overall, his EKG was felt stable from prior EKG and findings were thought to represent early repolarization.  His troponin was  negative.  CXR on 03/27/12 showed: Moderate cardiac silhouette enlargement. Minimal vascular  congestion pattern with slight increase in perihilar markings and central peribronchial thickening. No alveolar pulmonary edema, consolidation, or pleural effusion is seen. Chronic elevation of right hemidiaphragm.  Labs noted.  BUN/Cr 64/3.03 (previously 39-42/2.13-2.79 since 02/2012).  H/H 10.7/33.6.  (Renal notes requested, but are still pending.)  I reviewed above with Anesthesiologist Dr. Noreene Larsson.  Plan to repeat a BMET on arrival to ensure stability of patient's renal function.  If follow-up labs are stable and no acute changes in patient's CV status then anticipate he can proceed as planned.  Shonna Chock, PA-C 09/18/12 1328  Addendum: 09/20/12 1235 I reviewed records from Dr. Allena Katz.  He is aware of upcoming surgery.  Mr. Prisk Cr was 2.9 at that time.  By notes, Dr. Allena Katz informed him that he was at "high risk for acute-on-chronic renal failure and likely initiation of hemodialysis in the future, particularly in the surgery was hemodynamically impactful on his kidneys."  Nephrology can be consulted post-operatively if any acute renal failure issues.  (See Dr. Eliane Decree note from 09/04/12.)  As above, he is for a repeat BMET on arrival.

## 2012-09-18 NOTE — Telephone Encounter (Signed)
Faxed clearance to [facility] @ [phone]

## 2012-09-18 NOTE — Telephone Encounter (Signed)
[name] at short stay called requesting clearance notes on pt from Dr Katrinka Blazing and Dr Jerelyn Scott. Could not locate notation in epic that we received them. [name] can be reached at [phone]. I advised her I will send msg to [place].

## 2012-09-25 ENCOUNTER — Ambulatory Visit (HOSPITAL_COMMUNITY)
Admission: RE | Admit: 2012-09-25 | Discharge: 2012-09-26 | Disposition: A | Discharge status: Home / Self Care | Payer: Medicare Other | Source: Ambulatory Visit | Attending: Surgery | Admitting: Surgery

## 2012-09-25 ENCOUNTER — Inpatient Hospital Stay (HOSPITAL_COMMUNITY): Payer: Medicare Other | Admitting: Vascular Surgery

## 2012-09-25 ENCOUNTER — Encounter (HOSPITAL_COMMUNITY): Payer: Self-pay | Admitting: General Practice

## 2012-09-25 ENCOUNTER — Encounter (HOSPITAL_COMMUNITY): Payer: Self-pay | Admitting: Vascular Surgery

## 2012-09-25 ENCOUNTER — Encounter (HOSPITAL_COMMUNITY)
Admission: RE | Disposition: A | Discharge status: Home / Self Care | Payer: Self-pay | Source: Ambulatory Visit | Attending: Surgery

## 2012-09-25 DIAGNOSIS — I639 Cerebral infarction, unspecified: Secondary | ICD-10-CM

## 2012-09-25 DIAGNOSIS — Z01812 Encounter for preprocedural laboratory examination: Secondary | ICD-10-CM | POA: Insufficient documentation

## 2012-09-25 DIAGNOSIS — Z8673 Personal history of transient ischemic attack (TIA), and cerebral infarction without residual deficits: Secondary | ICD-10-CM | POA: Insufficient documentation

## 2012-09-25 DIAGNOSIS — G4700 Insomnia, unspecified: Secondary | ICD-10-CM

## 2012-09-25 DIAGNOSIS — Y838 Other surgical procedures as the cause of abnormal reaction of the patient, or of later complication, without mention of misadventure at the time of the procedure: Secondary | ICD-10-CM | POA: Insufficient documentation

## 2012-09-25 DIAGNOSIS — I2510 Atherosclerotic heart disease of native coronary artery without angina pectoris: Secondary | ICD-10-CM | POA: Insufficient documentation

## 2012-09-25 DIAGNOSIS — N400 Enlarged prostate without lower urinary tract symptoms: Secondary | ICD-10-CM

## 2012-09-25 DIAGNOSIS — M5136 Other intervertebral disc degeneration, lumbar region: Secondary | ICD-10-CM

## 2012-09-25 DIAGNOSIS — IMO0002 Reserved for concepts with insufficient information to code with codable children: Secondary | ICD-10-CM | POA: Insufficient documentation

## 2012-09-25 DIAGNOSIS — N189 Chronic kidney disease, unspecified: Secondary | ICD-10-CM

## 2012-09-25 DIAGNOSIS — N9989 Other postprocedural complications and disorders of genitourinary system: Secondary | ICD-10-CM | POA: Insufficient documentation

## 2012-09-25 DIAGNOSIS — Y9210 Unspecified residential institution as the place of occurrence of the external cause: Secondary | ICD-10-CM | POA: Insufficient documentation

## 2012-09-25 DIAGNOSIS — M1990 Unspecified osteoarthritis, unspecified site: Secondary | ICD-10-CM | POA: Insufficient documentation

## 2012-09-25 DIAGNOSIS — I129 Hypertensive chronic kidney disease with stage 1 through stage 4 chronic kidney disease, or unspecified chronic kidney disease: Secondary | ICD-10-CM | POA: Insufficient documentation

## 2012-09-25 DIAGNOSIS — R339 Retention of urine, unspecified: Secondary | ICD-10-CM | POA: Insufficient documentation

## 2012-09-25 DIAGNOSIS — K432 Incisional hernia without obstruction or gangrene: Principal | ICD-10-CM | POA: Insufficient documentation

## 2012-09-25 DIAGNOSIS — K430 Incisional hernia with obstruction, without gangrene: Secondary | ICD-10-CM

## 2012-09-25 DIAGNOSIS — K279 Peptic ulcer, site unspecified, unspecified as acute or chronic, without hemorrhage or perforation: Secondary | ICD-10-CM

## 2012-09-25 DIAGNOSIS — I10 Essential (primary) hypertension: Secondary | ICD-10-CM

## 2012-09-25 HISTORY — DX: Gout, unspecified: M10.9

## 2012-09-25 HISTORY — PX: VENTRAL HERNIA REPAIR: SHX424

## 2012-09-25 HISTORY — DX: Chronic kidney disease, stage 4 (severe): N18.4

## 2012-09-25 HISTORY — DX: Low back pain: M54.5

## 2012-09-25 HISTORY — DX: Low back pain, unspecified: M54.50

## 2012-09-25 HISTORY — DX: Shortness of breath: R06.02

## 2012-09-25 HISTORY — DX: Other chronic pain: G89.29

## 2012-09-25 HISTORY — PX: INSERTION OF MESH: SHX5868

## 2012-09-25 HISTORY — DX: Gastrointestinal hemorrhage, unspecified: K92.2

## 2012-09-25 HISTORY — PX: HERNIA REPAIR: SHX51

## 2012-09-25 HISTORY — DX: Personal history of other medical treatment: Z92.89

## 2012-09-25 LAB — BASIC METABOLIC PANEL
BUN: 55 mg/dL — ABNORMAL HIGH (ref 6–23)
Calcium: 9.6 mg/dL (ref 8.4–10.5)
Chloride: 98 mEq/L (ref 96–112)
Creatinine, Ser: 3.03 mg/dL — ABNORMAL HIGH (ref 0.50–1.35)
GFR calc Af Amer: 20 mL/min — ABNORMAL LOW (ref >90)
GFR calc non Af Amer: 17 mL/min — ABNORMAL LOW (ref >90)

## 2012-09-25 LAB — CBC
Hemoglobin: 10 g/dL — ABNORMAL LOW (ref 13.0–17.0)
MCH: 30 pg (ref 26.0–34.0)
MCHC: 32.4 g/dL (ref 30.0–36.0)

## 2012-09-25 LAB — CREATININE, SERUM: Creatinine, Ser: 2.95 mg/dL — ABNORMAL HIGH (ref 0.50–1.35)

## 2012-09-25 SURGERY — REPAIR, HERNIA, VENTRAL, LAPAROSCOPIC
Anesthesia: General | Wound class: Clean

## 2012-09-25 MED ORDER — OXYCODONE HCL 5 MG/5ML PO SOLN: 5.0000 mg | Freq: Once | ORAL | Status: AC | PRN

## 2012-09-25 MED ORDER — ONDANSETRON HCL 4 MG/2ML IJ SOLN: 4.0000 mg | Freq: Once | INTRAMUSCULAR | Status: DC | PRN

## 2012-09-25 MED ORDER — DEXTROSE 5 % IV SOLN
3.0000 g | INTRAVENOUS | Status: DC
  Filled 2012-09-25: qty 3000

## 2012-09-25 MED ORDER — HYDROCODONE-ACETAMINOPHEN 10-325 MG PO TABS
1.0000 | ORAL_TABLET | Freq: Four times a day (QID) | ORAL | Status: DC | PRN
  Administered 2012-09-26: 1 via ORAL
  Filled 2012-09-25 (×2): qty 1

## 2012-09-25 MED ORDER — OXYCODONE-ACETAMINOPHEN 5-325 MG PO TABS
1.0000 | ORAL_TABLET | ORAL | Status: DC | PRN
  Administered 2012-09-25 – 2012-09-26 (×2): 2 via ORAL
  Filled 2012-09-25 (×2): qty 2

## 2012-09-25 MED ORDER — PANTOPRAZOLE SODIUM 20 MG PO TBEC
20.0000 mg | DELAYED_RELEASE_TABLET | Freq: Every day | ORAL | Status: DC
  Administered 2012-09-26: 20 mg via ORAL
  Filled 2012-09-25: qty 1

## 2012-09-25 MED ORDER — GLYCOPYRROLATE 0.2 MG/ML IJ SOLN
INTRAMUSCULAR | Status: DC | PRN
  Administered 2012-09-25: .6 mg via INTRAVENOUS

## 2012-09-25 MED ORDER — VECURONIUM BROMIDE 10 MG IV SOLR
INTRAVENOUS | Status: DC | PRN
  Administered 2012-09-25: 1 mg via INTRAVENOUS
  Administered 2012-09-25: 4 mg via INTRAVENOUS

## 2012-09-25 MED ORDER — COLCHICINE 0.6 MG PO TABS
0.6000 mg | ORAL_TABLET | Freq: Two times a day (BID) | ORAL | Status: DC | PRN
  Filled 2012-09-25: qty 1

## 2012-09-25 MED ORDER — ONDANSETRON HCL 4 MG PO TABS: 4.0000 mg | ORAL_TABLET | Freq: Four times a day (QID) | ORAL | Status: DC | PRN

## 2012-09-25 MED ORDER — ENOXAPARIN SODIUM 30 MG/0.3ML ~~LOC~~ SOLN
30.0000 mg | SUBCUTANEOUS | Status: DC
  Administered 2012-09-26: 30 mg via SUBCUTANEOUS
  Filled 2012-09-25 (×3): qty 0.3

## 2012-09-25 MED ORDER — ALBUMIN HUMAN 5 % IV SOLN
INTRAVENOUS | Status: DC | PRN
  Administered 2012-09-25: 08:00:00 via INTRAVENOUS

## 2012-09-25 MED ORDER — EPHEDRINE SULFATE 50 MG/ML IJ SOLN
INTRAMUSCULAR | Status: DC | PRN
  Administered 2012-09-25: 5 mg via INTRAVENOUS
  Administered 2012-09-25 (×2): 10 mg via INTRAVENOUS
  Administered 2012-09-25: 5 mg via INTRAVENOUS
  Administered 2012-09-25: 10 mg via INTRAVENOUS
  Administered 2012-09-25 (×2): 5 mg via INTRAVENOUS

## 2012-09-25 MED ORDER — HYDROMORPHONE HCL PF 1 MG/ML IJ SOLN
1.0000 mg | INTRAMUSCULAR | Status: DC | PRN
  Administered 2012-09-25 (×2): 0.25 mg via INTRAVENOUS
  Administered 2012-09-26: 1 mg via INTRAVENOUS
  Filled 2012-09-25 (×3): qty 1

## 2012-09-25 MED ORDER — LEVOTHYROXINE SODIUM 75 MCG PO TABS
75.0000 ug | ORAL_TABLET | Freq: Every day | ORAL | Status: DC
  Administered 2012-09-26: 75 ug via ORAL
  Filled 2012-09-25 (×3): qty 1

## 2012-09-25 MED ORDER — ARTIFICIAL TEARS OP OINT
TOPICAL_OINTMENT | OPHTHALMIC | Status: DC | PRN
  Administered 2012-09-25: 1 via OPHTHALMIC

## 2012-09-25 MED ORDER — NITROGLYCERIN 0.4 MG SL SUBL: 0.4000 mg | SUBLINGUAL_TABLET | SUBLINGUAL | Status: DC | PRN

## 2012-09-25 MED ORDER — NEOSTIGMINE METHYLSULFATE 1 MG/ML IJ SOLN
INTRAMUSCULAR | Status: DC | PRN
  Administered 2012-09-25: 4 mg via INTRAVENOUS

## 2012-09-25 MED ORDER — BUPIVACAINE-EPINEPHRINE 0.25% -1:200000 IJ SOLN
INTRAMUSCULAR | Status: DC | PRN
  Administered 2012-09-25: 4 mL

## 2012-09-25 MED ORDER — OXYCODONE HCL 5 MG PO TABS
5.0000 mg | ORAL_TABLET | Freq: Once | ORAL | Status: AC | PRN
  Administered 2012-09-25: 5 mg via ORAL

## 2012-09-25 MED ORDER — ATENOLOL 25 MG PO TABS
25.0000 mg | ORAL_TABLET | Freq: Every day | ORAL | Status: DC
  Filled 2012-09-25: qty 1

## 2012-09-25 MED ORDER — OXYCODONE HCL 5 MG PO TABS
ORAL_TABLET | ORAL | Status: AC
  Filled 2012-09-25: qty 1

## 2012-09-25 MED ORDER — HYDROMORPHONE HCL PF 1 MG/ML IJ SOLN
0.2500 mg | INTRAMUSCULAR | Status: DC | PRN
  Administered 2012-09-25 (×2): 0.5 mg via INTRAVENOUS

## 2012-09-25 MED ORDER — CLINDAMYCIN PHOSPHATE 300 MG/50ML IV SOLN
300.0000 mg | Freq: Once | INTRAVENOUS | Status: AC
  Administered 2012-09-25: 300 mg via INTRAVENOUS
  Filled 2012-09-25: qty 50

## 2012-09-25 MED ORDER — BUPIVACAINE-EPINEPHRINE PF 0.25-1:200000 % IJ SOLN
INTRAMUSCULAR | Status: AC
  Filled 2012-09-25: qty 30

## 2012-09-25 MED ORDER — MEPERIDINE HCL 25 MG/ML IJ SOLN: 6.2500 mg | INTRAMUSCULAR | Status: DC | PRN

## 2012-09-25 MED ORDER — 0.9 % SODIUM CHLORIDE (POUR BTL) OPTIME
TOPICAL | Status: DC | PRN
  Administered 2012-09-25: 1000 mL

## 2012-09-25 MED ORDER — FENTANYL CITRATE 0.05 MG/ML IJ SOLN
INTRAMUSCULAR | Status: DC | PRN
  Administered 2012-09-25 (×4): 25 ug via INTRAVENOUS
  Administered 2012-09-25: 100 ug via INTRAVENOUS
  Administered 2012-09-25 (×2): 25 ug via INTRAVENOUS

## 2012-09-25 MED ORDER — FUROSEMIDE 20 MG PO TABS
20.0000 mg | ORAL_TABLET | Freq: Every day | ORAL | Status: DC
  Administered 2012-09-25 – 2012-09-26 (×2): 20 mg via ORAL
  Filled 2012-09-25 (×3): qty 1

## 2012-09-25 MED ORDER — TAMSULOSIN HCL 0.4 MG PO CAPS
0.4000 mg | ORAL_CAPSULE | Freq: Every day | ORAL | Status: DC
  Administered 2012-09-25: 0.4 mg via ORAL
  Filled 2012-09-25 (×2): qty 1

## 2012-09-25 MED ORDER — LIDOCAINE HCL (CARDIAC) 20 MG/ML IV SOLN
INTRAVENOUS | Status: DC | PRN
  Administered 2012-09-25: 50 mg via INTRAVENOUS

## 2012-09-25 MED ORDER — SODIUM CHLORIDE 0.9 % IV SOLN
INTRAVENOUS | Status: DC | PRN
  Administered 2012-09-25 (×2): via INTRAVENOUS

## 2012-09-25 MED ORDER — ONDANSETRON HCL 4 MG/2ML IJ SOLN
INTRAMUSCULAR | Status: DC | PRN
  Administered 2012-09-25: 4 mg via INTRAVENOUS

## 2012-09-25 MED ORDER — ONDANSETRON HCL 4 MG/2ML IJ SOLN: 4.0000 mg | Freq: Four times a day (QID) | INTRAMUSCULAR | Status: DC | PRN

## 2012-09-25 MED ORDER — SUCCINYLCHOLINE CHLORIDE 20 MG/ML IJ SOLN
INTRAMUSCULAR | Status: DC | PRN
  Administered 2012-09-25: 100 mg via INTRAVENOUS

## 2012-09-25 MED ORDER — PROPOFOL 10 MG/ML IV BOLUS
INTRAVENOUS | Status: DC | PRN
  Administered 2012-09-25: 100 mg via INTRAVENOUS

## 2012-09-25 MED ORDER — HYDROMORPHONE HCL PF 1 MG/ML IJ SOLN
INTRAMUSCULAR | Status: AC
  Administered 2012-09-25: 0.5 mg
  Filled 2012-09-25: qty 1

## 2012-09-25 MED ORDER — SIMVASTATIN 20 MG PO TABS
20.0000 mg | ORAL_TABLET | Freq: Every day | ORAL | Status: DC
  Administered 2012-09-25: 20 mg via ORAL
  Filled 2012-09-25 (×2): qty 1

## 2012-09-25 MED ORDER — PHENYLEPHRINE HCL 10 MG/ML IJ SOLN
INTRAMUSCULAR | Status: DC | PRN
  Administered 2012-09-25: 40 ug via INTRAVENOUS

## 2012-09-25 MED ORDER — DEXTROSE-NACL 5-0.9 % IV SOLN
INTRAVENOUS | Status: DC
  Administered 2012-09-25 – 2012-09-26 (×2): via INTRAVENOUS
  Filled 2012-09-25: qty 1000

## 2012-09-25 SURGICAL SUPPLY — 65 items
ADH SKN CLS APL DERMABOND .7 (GAUZE/BANDAGES/DRESSINGS) ×1
APPLIER CLIP ROT 10 11.4 M/L (STAPLE)
APR CLP MED LRG 11.4X10 (STAPLE)
BINDER ABD UNIV 12 45-62 (WOUND CARE) ×1 IMPLANT
BINDER ABDOMINAL 46IN 62IN (WOUND CARE) ×2
BLADE SURG 10 STRL SS (BLADE) ×2 IMPLANT
BLADE SURG ROTATE 9660 (MISCELLANEOUS) ×1 IMPLANT
CANISTER SUCTION 2500CC (MISCELLANEOUS) IMPLANT
CHLORAPREP W/TINT 26ML (MISCELLANEOUS) ×2 IMPLANT
CLIP APPLIE ROT 10 11.4 M/L (STAPLE) IMPLANT
CLOTH BEACON ORANGE TIMEOUT ST (SAFETY) ×2 IMPLANT
COVER SURGICAL LIGHT HANDLE (MISCELLANEOUS) ×2 IMPLANT
DECANTER SPIKE VIAL GLASS SM (MISCELLANEOUS) ×2 IMPLANT
DERMABOND ADVANCED (GAUZE/BANDAGES/DRESSINGS) ×1
DERMABOND ADVANCED .7 DNX12 (GAUZE/BANDAGES/DRESSINGS) ×1 IMPLANT
DEVICE SECURE STRAP 25 ABSORB (INSTRUMENTS) ×4 IMPLANT
DEVICE TROCAR PUNCTURE CLOSURE (ENDOMECHANICALS) ×2 IMPLANT
DRAPE UTILITY 15X26 W/TAPE STR (DRAPE) ×4 IMPLANT
DRAPE WARM FLUID 44X44 (DRAPE) ×2 IMPLANT
ELECT CAUTERY BLADE 6.4 (BLADE) ×2 IMPLANT
ELECT REM PT RETURN 9FT ADLT (ELECTROSURGICAL) ×2
ELECTRODE REM PT RTRN 9FT ADLT (ELECTROSURGICAL) ×1 IMPLANT
GAUZE SPONGE 2X2 8PLY STRL LF (GAUZE/BANDAGES/DRESSINGS) IMPLANT
GLOVE BIO SURGEON STRL SZ7.5 (GLOVE) ×1 IMPLANT
GLOVE BIO SURGEON STRL SZ8 (GLOVE) ×2 IMPLANT
GLOVE BIOGEL PI IND STRL 7.0 (GLOVE) IMPLANT
GLOVE BIOGEL PI IND STRL 7.5 (GLOVE) IMPLANT
GLOVE BIOGEL PI IND STRL 8 (GLOVE) ×1 IMPLANT
GLOVE BIOGEL PI INDICATOR 7.0 (GLOVE) ×2
GLOVE BIOGEL PI INDICATOR 7.5 (GLOVE) ×1
GLOVE BIOGEL PI INDICATOR 8 (GLOVE) ×1
GLOVE ECLIPSE 7.0 STRL STRAW (GLOVE) ×1 IMPLANT
GOWN STRL NON-REIN LRG LVL3 (GOWN DISPOSABLE) ×6 IMPLANT
KIT BASIN OR (CUSTOM PROCEDURE TRAY) ×2 IMPLANT
KIT ROOM TURNOVER OR (KITS) ×2 IMPLANT
MARKER SKIN DUAL TIP RULER LAB (MISCELLANEOUS) ×2 IMPLANT
MESH PARIETEX 20X15 (Mesh General) ×1 IMPLANT
NDL SPNL 22GX3.5 QUINCKE BK (NEEDLE) ×1 IMPLANT
NEEDLE SPNL 22GX3.5 QUINCKE BK (NEEDLE) ×2 IMPLANT
NS IRRIG 1000ML POUR BTL (IV SOLUTION) ×2 IMPLANT
PAD ARMBOARD 7.5X6 YLW CONV (MISCELLANEOUS) ×2 IMPLANT
PENCIL BUTTON HOLSTER BLD 10FT (ELECTRODE) ×2 IMPLANT
SCALPEL HARMONIC ACE (MISCELLANEOUS) ×2 IMPLANT
SET IRRIG TUBING LAPAROSCOPIC (IRRIGATION / IRRIGATOR) IMPLANT
SLEEVE ENDOPATH XCEL 5M (ENDOMECHANICALS) ×5 IMPLANT
SPONGE GAUZE 2X2 STER 10/PKG (GAUZE/BANDAGES/DRESSINGS) ×1
SPONGE GAUZE 4X4 12PLY (GAUZE/BANDAGES/DRESSINGS) ×1 IMPLANT
STAPLER VISISTAT 35W (STAPLE) ×1 IMPLANT
SUT MON AB 4-0 PC3 18 (SUTURE) ×3 IMPLANT
SUT NOVA NAB DX-16 0-1 5-0 T12 (SUTURE) ×4 IMPLANT
SUT VIC AB 3-0 SH 18 (SUTURE) IMPLANT
SUT VICRYL 0 UR6 27IN ABS (SUTURE) ×2 IMPLANT
TAPE CLOTH SOFT 2X10 (GAUZE/BANDAGES/DRESSINGS) ×1 IMPLANT
TAPE CLOTH SURG 4X10 WHT LF (GAUZE/BANDAGES/DRESSINGS) ×1 IMPLANT
TOWEL OR 17X24 6PK STRL BLUE (TOWEL DISPOSABLE) ×2 IMPLANT
TOWEL OR 17X26 10 PK STRL BLUE (TOWEL DISPOSABLE) ×2 IMPLANT
TRAY FOLEY CATH 14FR (SET/KITS/TRAYS/PACK) ×2 IMPLANT
TRAY LAPAROSCOPIC (CUSTOM PROCEDURE TRAY) ×2 IMPLANT
TROCAR XCEL 12X100 BLDLESS (ENDOMECHANICALS) IMPLANT
TROCAR XCEL BLUNT TIP 100MML (ENDOMECHANICALS) IMPLANT
TROCAR XCEL NON-BLD 11X100MML (ENDOMECHANICALS) IMPLANT
TROCAR XCEL NON-BLD 5MMX100MML (ENDOMECHANICALS) ×2 IMPLANT
TUBE CONNECTING 12X1/4 (SUCTIONS) ×2 IMPLANT
WATER STERILE IRR 1000ML POUR (IV SOLUTION) IMPLANT
YANKAUER SUCT BULB TIP NO VENT (SUCTIONS) ×2 IMPLANT

## 2012-09-25 NOTE — H&P (Signed)
Currently admitted as of 09/25/2012  Demographics [name] 79 year old male  Comm Pref: None [address]  [place] [address] [phone] (H) [phone] (M)    Problem ListHospitalization ProblemNon-Hospital  CAD (coronary artery disease)  HTN (hypertension)  CVA (cerebral infarction)  Osteoarthritis  Peptic ulcer disease  Chronic kidney disease  DDD (degenerative disc disease), lumbar  BPH (benign prostatic hyperplasia)  Insomnia  Significant History/Details  Smoking: Former Smoker (Quit Date:07/20/1954)  Smokeless Tobacco: Unknown  Alcohol: No  Comments: All calls to Lenis Noon for this patient: [phone]. Patient and wife both are very elderly and extremely hard of hearing. bp  No open orders  Language: English   Specialty CommentsEditShow AllReport11/19/13 pt signed Rhumell Veracruz 10/25/1925 & Ethelene Browns (sister) & Marquelle Musgrave 08/17/1949 DOS 09/25/12 [facility]-Lap Incis Hernia with mesh 08/14/12 dm 09/20/2012 patient scheduled for in-patient surgery 09/25/2012 @ [facility] authorization # [id number] per Phil @ [facility]([phone]) dm,chm   MedicationsFacility-Administered Medications Show prescriptions  ceFAZolin (ANCEF) 3 g in dextrose 5 % 50 mL IVPB  clindamycin (CLEOCIN) IVPB 300 mg    Relevant Labs (3 years)  Na K Cl C02 WBC Hgb Hct Plts  09/14/12 1316 -- -- -- -- 5.6 10.7 33.6 178  09/14/12 1316 144 3.7 100 -- -- -- -- --  08/02/12 2200 -- -- -- -- 5.0 9.1 28.3 171  08/02/12 2200 141 3.8 103 -- -- -- -- --  03/27/12 1522 -- -- -- -- 5.6 13.3 39.4 170                  Relevant Encounters (Maximum of 10 visits)Date Type Department Provider Description  09/25/2012 [facility] OPERATING ROOM Dortha Schwalbe., MD   09/18/2012 Telephone [facility] [place], LPN   84/13/2440 [facility] [place], [place]   08/30/2012 Telephone [facility] [place] Ivory Broad,  RN   08/10/2012 Orders Only [facility], [place] Harriette Bouillon A., MD   08/07/2012 Office Visit [facility] Harriette Bouillon A., MD Incisional Hernia (Primary Dx)          My Last Outpatient Progress NoteStatus Last Edited Encounter Date   Patient ID: [name], male   DOB: October 06, 1922, 79 y.o.   MRN: [id number]    Chief Complaint   Patient presents with   .  Incisional Hernia       eval hernia      HPI [name] is a 79 y.o. male.  Patient sent at the request of Dr. Kevan Ny for incisional hernia. The patient has had a painful bulge just above his umbilicus for a number of months. He has been seen once in the emergency room for it were a CT scan was done which shows a 3 cm supraumbilical hernia with fat. He's had a previous operation for ulcer disease in 1980s. He has intermittent pain with this hernia and the pain is severe and sharp at times made worse with eating or exertion. He has significant heart and kidney disease. These seem to be stable. The bulging his abdomen is tender it does slide in and out. HPI    Past Medical History   Diagnosis  Date   .  CAD (coronary artery disease)     .  HTN (hypertension)     .  CVA (cerebral infarction)     .  Osteoarthritis     .  Peptic ulcer disease     .  Chronic kidney disease     .  DDD (degenerative disc disease), lumbar     .  BPH (benign prostatic hyperplasia)     .  Insomnia     .  Stroke     .  GERD (gastroesophageal reflux disease)     .  Cataract     .  CHF (congestive heart failure)     .  Hyperlipidemia     .  Thyroid disease         Past Surgical History   Procedure  Date   .  Coronary angioplasty with stent placement     .  Vagotomy and pyloroplasty     .  Excision of right palm dypuytrens contracture     .  S/p decompressive laminectomy,medial facetectomy and forminotomy l4-l5     .  Great toe chilectomy left     .  Hemoclipped gastric ulcer  2009   .  Lumbar epidural steroid  injection l2-3  2010      No family history on file.   Social History History   Substance Use Topics   .  Smoking status:  Former Smoker       Quit date:  07/20/1954   .  Smokeless tobacco:  Not on file   .  Alcohol Use:  No       Allergies   Allergen  Reactions   .  Penicillins  Hives   .  Allopurinol  Other (See Comments)       Unknown     .  Benicar (Olmesartan Medoxomil)  Other (See Comments)       unknown       Current Outpatient Prescriptions   Medication  Sig  Dispense  Refill   .  aspirin EC 81 MG tablet  Take 81 mg by mouth daily.         Marland Kitchen  atenolol (TENORMIN) 50 MG tablet  Take 50 mg by mouth every evening.          .  cholecalciferol (VITAMIN D) 1000 UNITS tablet  Take 1,000 Units by mouth daily.          .  clopidogrel (PLAVIX) 75 MG tablet  Take 75 mg by mouth daily.         .  Coenzyme Q10 (COQ-10) 100 MG CAPS  Take 1 capsule by mouth daily.         .  colchicine 0.6 MG tablet  Take 0.6 mg by mouth daily.         .  furosemide (LASIX) 40 MG tablet  Take 20-40 mg by mouth See admin instructions. Alternates 1/2 tablet to 1 tablet every other day. Last dose was 20 mg.         .  levothyroxine (SYNTHROID, LEVOTHROID) 75 MCG tablet  Take 75 mcg by mouth daily.          .  metoprolol succinate (TOPROL-XL) 50 MG 24 hr tablet  Take 50 mg by mouth every evening. Take with or immediately following a meal.         .  nitroGLYCERIN (NITROSTAT) 0.4 MG SL tablet  Place 0.4 mg under the tongue every 5 (five) minutes as needed. For chest pain         .  pantoprazole (PROTONIX) 40 MG tablet  Take 40 mg by mouth daily.         .  simvastatin (ZOCOR) 20 MG tablet  Take 1 tablet (  20 mg total) by mouth daily at 6 PM.   30 tablet   11   .  Tamsulosin HCl (FLOMAX) 0.4 MG CAPS  Take 0.4 mg by mouth at bedtime.          Marland Kitchen  HYDROcodone-acetaminophen (NORCO) 10-325 MG per tablet  Take 1 tablet by mouth every 6 (six) hours as needed for pain.   20 tablet   0      Review of  Systems Review of Systems  Constitutional: Positive for activity change. Negative for fatigue.  HENT: Negative.   Eyes: Negative.   Respiratory: Negative.   Cardiovascular: Negative.   Gastrointestinal: Positive for abdominal pain. Negative for nausea.  Genitourinary: Negative.   Musculoskeletal: Negative.   Neurological: Negative.   Hematological: Negative.   Psychiatric/Behavioral: Negative.     Blood pressure 122/62, pulse 64, temperature 97 F (36.1 C), resp. rate 16, height 5\' 8"  (1.727 m), weight 158 lb (71.668 kg).   Physical Exam Physical Exam  Constitutional: He appears well-developed and well-nourished.  HENT:   Head: Normocephalic and atraumatic.  Eyes: EOM are normal. Pupils are equal, round, and reactive to light.  Neck: Normal range of motion. Neck supple.  Cardiovascular: Normal rate and regular rhythm.    No murmur heard. Pulmonary/Chest: Effort normal and breath sounds normal. No respiratory distress.  Abdominal:      Data Reviewed Clinical Data: Abdominal pain. Evaluate ventral hernia.   CT ABDOMEN AND PELVIS WITHOUT CONTRAST   Technique: Multidetector CT imaging of the abdomen and pelvis was   performed following the standard protocol without intravenous   contrast.   Comparison: None   Findings: Lung bases: Dependent changes identified in both lung   bases. Chronic-appearing interstitial reticulation is also   identified which appears lower lobe predominant. The heart size is   enlarged. There are prominent calcifications involving the LAD and   RCA coronary artery. No pericardial effusion. Calcified lymph node   noted within the right hilar region. There is a calcified   granuloma in the right middle lobe.   Abdomen/pelvis: No focal liver abnormality. Gallbladder appears   normal. No biliary dilatation. The pancreas appears within normal   limits.   The spleen is normal.   Normal appearing bilateral adrenal glands. Mild bilateral renal    atrophy. The kidneys are otherwise unremarkable. There is a   diverticula arising from the right posterior lateral wall of the   bladder. This measures 3.4 x 3.7 cm, image 79. Bladder otherwise   normal. Prostate gland appears enlarged.   No enlarged upper abdominal lymph nodes. No pelvic or inguinal   adenopathy.   Surgical clips identified in the region of the proximal stomach.   The small bowel loops have a normal course and caliber. The   appendix is visualized and appears normal. Multiple descending   colon and sigmoid colon diverticula identified. No evidence for   acute diverticulitis.   No free fluid or fluid collections identified within the abdomen or   pelvis.   Bones/Musculoskeletal: There is a scoliosis deformity which is   convex to the right. Multilevel degenerative disc disease is seen   throughout the lumbar spine. Most severe at L5-S1. Bilateral L5   pars defects are noted and there is a first-degree anterolisthesis   of L5 on S1.   Suture material is identified along the ventral surface of the   abdominal wall fascia. There is a supraumbilical hernia which   contains fat only. The mouth  of the hernia measures 2 cm. The   hernia itself measures 5.4 cm width.   IMPRESSION:   1. No acute findings identified within the abdomen or pelvis.   2. Supraumbilical ventral abdominal wall hernia contains fat only.   3. Descending colon and sigmoid colon diverticulosis.   4. Prominent coronary artery calcifications.   5. Bilateral basilar predominant interstitial reticulation.   6. Prior granulomatous disease.   Original Report Authenticated By: Signa Kell, M.D.      Assessment Incisional hernia symptomatic Patient Active Problem List   Diagnosis   .  CAD (coronary artery disease)   .  HTN (hypertension)   .  CVA (cerebral infarction)   .  Osteoarthritis   .  Peptic ulcer disease   .  Chronic kidney disease   .  DDD (degenerative disc disease), lumbar   .  BPH  (benign prostatic hyperplasia)   .  Insomnia      Plan Spent 45 min discussing surgery and other treatment options.  High operative risk.  Will ask for medical clearance.  A laparoscopic approach is possible.  The risk of hernia repair include bleeding,  Infection,   Recurrence of the hernia,  Mesh use, chronic pain,  Organ injury,  Bowel injury,  Bladder injury,   nerve injury with numbness around the incision,  Death,  and worsening of preexisting  medical problems.  The alternatives to surgery have been discussed as well..  Long term expectations of both operative and non operative treatments have been discussed.   The patient agrees to proceed if OK with cardiology and medicine.  Will try laparoscopic possible open incisional hernia repair if cleared.       Maxwell Aro A. 09/25/2012

## 2012-09-25 NOTE — Anesthesia Preprocedure Evaluation (Addendum)
Anesthesia Evaluation  Patient identified by MRN, date of birth, ID band Patient awake    Reviewed: Allergy & Precautions, H&P , NPO status , Patient's Chart, lab work & pertinent test results  Airway Mallampati: II TM Distance: <3 FB Neck ROM: Full    Dental  (+) Edentulous Upper and Edentulous Lower   Pulmonary shortness of breath and with exertion,          Cardiovascular hypertension, Pt. on medications + CAD and +CHF     Neuro/Psych CVA, No Residual Symptoms    GI/Hepatic hiatal hernia, PUD, GERD-  Poorly Controlled,  Endo/Other  Hypothyroidism   Renal/GU CRF and Renal InsufficiencyRenal disease     Musculoskeletal   Abdominal   Peds  Hematology   Anesthesia Other Findings   Reproductive/Obstetrics                           Anesthesia Physical Anesthesia Plan  ASA: III  Anesthesia Plan: General   Post-op Pain Management:    Induction: Intravenous, Rapid sequence and Cricoid pressure planned  Airway Management Planned: Oral ETT  Additional Equipment:   Intra-op Plan:   Post-operative Plan: Extubation in OR  Informed Consent: I have reviewed the patients History and Physical, chart, labs and discussed the procedure including the risks, benefits and alternatives for the proposed anesthesia with the patient or authorized representative who has indicated his/her understanding and acceptance.   Dental advisory given  Plan Discussed with: CRNA  Anesthesia Plan Comments:        Anesthesia Quick Evaluation

## 2012-09-25 NOTE — Interval H&P Note (Signed)
History and Physical Interval Note:  09/25/2012 7:10 AM  [name]  has presented today for surgery, with the diagnosis of incisional hernia  The various methods of treatment have been discussed with the patient and family. After consideration of risks, benefits and other options for treatment, the patient has consented to  Procedure(s) (LRB) with comments: LAPAROSCOPIC VENTRAL HERNIA (N/A) - laparoscopic incisional hernia repair with mesh INSERTION OF MESH (N/A) as a surgical intervention .  The patient's history has been reviewed, patient examined, no change in status, stable for surgery.  I have reviewed the patient's chart and labs.  Questions were answered to the patient's satisfaction.     Pastor Sgro A.

## 2012-09-25 NOTE — Brief Op Note (Signed)
09/25/2012  9:52 AM  PATIENT:  [name]  79 y.o. male  PRE-OPERATIVE DIAGNOSIS:  incisional hernia  POST-OPERATIVE DIAGNOSIS:  incisional hernia  PROCEDURE:  Procedure(s) (LRB) with comments: LAPAROSCOPIC VENTRAL HERNIA (N/A) - laparoscopic incisional hernia repair with mesh INSERTION OF MESH (N/A)  SURGEON:  Surgeon(s) and Role:    * Palestine Mosco A. Larcenia Holaday, MD - Primary  PHYSICIAN ASSISTANT: none  ASSISTANTS: none   ANESTHESIA:   local and general  EBL:  Total I/O In: 750 [I.V.:500; IV Piggyback:250] Out: 400 [Urine:400]  BLOOD ADMINISTERED:none  DRAINS: none   LOCAL MEDICATIONS USED:  MARCAINE     SPECIMEN:  No Specimen  DISPOSITION OF SPECIMEN:  N/A  COUNTS:  YES  TOURNIQUET:  * No tourniquets in log *  DICTATION: .Other Dictation: Dictation Number [id number]  PLAN OF CARE: Admit for overnight observation  PATIENT DISPOSITION:  PACU - hemodynamically stable.   Delay start of Pharmacological VTE agent (>24hrs) due to surgical blood loss or risk of bleeding: no

## 2012-09-25 NOTE — Transfer of Care (Signed)
Immediate Anesthesia Transfer of Care Note  Patient: [name]  Procedure(s) Performed: Procedure(s) (LRB) with comments: LAPAROSCOPIC VENTRAL HERNIA (N/A) - laparoscopic incisional hernia repair with mesh INSERTION OF MESH (N/A)  Patient Location: PACU  Anesthesia Type:General  Level of Consciousness: awake and alert   Airway & Oxygen Therapy: Patient Spontanous Breathing and Patient connected to nasal cannula oxygen  Post-op Assessment: Report given to PACU RN, Post -op Vital signs reviewed and stable and Patient moving all extremities  Post vital signs: Reviewed and stable  Complications: No apparent anesthesia complications

## 2012-09-25 NOTE — Anesthesia Postprocedure Evaluation (Signed)
Anesthesia Post Note  Patient: [name]  Procedure(s) Performed: Procedure(s) (LRB): LAPAROSCOPIC VENTRAL HERNIA (N/A) INSERTION OF MESH (N/A)  Anesthesia type: general  Patient location: PACU  Post pain: Pain level controlled  Post assessment: Patient's Cardiovascular Status Stable  Last Vitals:  Filed Vitals:   09/25/12 1200  BP: 115/49  Pulse: 67  Temp: 36.4 C  Resp: 13    Post vital signs: Reviewed and stable  Level of consciousness: sedated  Complications: No apparent anesthesia complications

## 2012-09-25 NOTE — Progress Notes (Signed)
The patient haven't void since his foley catheter was discontinued at 1238. The patient was bladder scanned. The bladder scanner showed 236 ml in the bladder.

## 2012-09-25 NOTE — Anesthesia Procedure Notes (Signed)
Procedure Name: Intubation Date/Time: 09/25/2012 7:41 AM Performed by: Luster Landsberg Pre-anesthesia Checklist: Patient identified, Emergency Drugs available, Suction available and Patient being monitored Patient Re-evaluated:Patient Re-evaluated prior to inductionOxygen Delivery Method: Circle system utilized Preoxygenation: Pre-oxygenation with 100% oxygen Intubation Type: IV induction, Rapid sequence and Cricoid Pressure applied Laryngoscope Size: Mac and 4 Grade View: Grade I Tube type: Oral Tube size: 7.0 mm Number of attempts: 1 Airway Equipment and Method: Stylet Placement Confirmation: ETT inserted through vocal cords under direct vision,  positive ETCO2 and breath sounds checked- equal and bilateral Secured at: 20 cm Tube secured with: Tape Dental Injury: Teeth and Oropharynx as per pre-operative assessment

## 2012-09-26 ENCOUNTER — Encounter (HOSPITAL_COMMUNITY): Payer: Self-pay | Admitting: Surgery

## 2012-09-26 LAB — CBC
HCT: 30.6 % — ABNORMAL LOW (ref 39.0–52.0)
Hemoglobin: 9.8 g/dL — ABNORMAL LOW (ref 13.0–17.0)
MCH: 30.2 pg (ref 26.0–34.0)
MCHC: 32 g/dL (ref 30.0–36.0)
MCV: 94.2 fL (ref 78.0–100.0)

## 2012-09-26 LAB — BASIC METABOLIC PANEL
BUN: 47 mg/dL — ABNORMAL HIGH (ref 6–23)
Chloride: 98 mEq/L (ref 96–112)
Glucose, Bld: 115 mg/dL — ABNORMAL HIGH (ref 70–99)
Potassium: 4.4 mEq/L (ref 3.5–5.1)

## 2012-09-26 MED ORDER — POLYETHYLENE GLYCOL 3350 17 G PO PACK
17.0000 g | PACK | Freq: Every day | ORAL | Status: DC
Start: 2012-09-26 — End: 2012-09-26
  Administered 2012-09-26: 17 g via ORAL
  Filled 2012-09-26: qty 1

## 2012-09-26 MED ORDER — HYDROCODONE-ACETAMINOPHEN 10-325 MG PO TABS: 1.0000 | ORAL_TABLET | Freq: Four times a day (QID) | ORAL | Status: DC | PRN

## 2012-09-26 MED ORDER — POLYETHYLENE GLYCOL 3350 17 G PO PACK: 17.0000 g | PACK | Freq: Every day | ORAL | Status: DC

## 2012-09-26 MED ORDER — CLOPIDOGREL BISULFATE 75 MG PO TABS: 75.0000 mg | ORAL_TABLET | Freq: Every day | ORAL | Status: DC

## 2012-09-26 NOTE — Op Note (Signed)
NAMEJAMARCUS, LADUKE NO.:  [id number]  MEDICAL RECORD NO.:  [id number]  LOCATION:  6N07C                        FACILITY:  [facility]  PHYSICIAN:  Maisie Fus A. Donyae Kohn, M.D.DATE OF BIRTH:  October 22, 1922  DATE OF PROCEDURE:  09/25/2012 DATE OF DISCHARGE:                              OPERATIVE REPORT   PREOPERATIVE DIAGNOSIS:  Incisional hernia.  POSTOPERATIVE DIAGNOSE:  Incisional hernia.  PROCEDURE:  Laparoscopic repair of 3 cm x 3 cm incisional hernia with 15 cm x 20 cm  Parietex composite mesh.  SURGEON:  Maisie Fus A. Valmore Arabie, M.D.  ANESTHESIA:  General endotracheal anesthesia with 0.25% Sensorcaine local with epinephrine.  ESTIMATED BLOOD LOSS:  Minimal.  SPECIMENS:  None.  INDICATIONS FOR PROCEDURE:  The patient is an 79 year old male with a painful incisional hernia.  He has multiple medical problems.  We discussed operative repair versus observation.  By CT scan, he had omentum incarcerated but no evidence of bowel incarcerated in the hernia.  He was a very high operative risk which I explained to him. After discussion of the above, pros and cons of surgery and observation, given the amount of pain he was in, he wished to have it repaired. Risks, benefits, and alternative therapies were discussed at great length which were outlined in my history and physical.  After discussion of the above, he wished to proceed.  IV FLUIDS:  Approximately 2 L of crystalloid and 500 mL of colloid.  DESCRIPTION OF PROCEDURE:  The patient was met in the holding area, questions were answered.  After induction of general anesthesia, the abdomen was prepped and draped in sterile fashion.  Foley catheter was placed.  Time-out was done.  He received 300 mg of clindamycin since he had a penicillin allergy.  Right lower quadrant Optiview port was placed under direct vision using the camera to guide [place] in the abdominal cavity.  We insufflated the abdominal cavity.  Due to some  adhesions, I could not see quite well, therefore, I placed a second Optiview in the left lower quadrant and placed this in direct vision.  We easily entered the abdominal cavity.  The other ports had not quite got all way in, actually pulled back some, so there was some insufflation of the preperitoneal space and the abdominal cavity.  No evidence of bowel injury though with this port insertion.  Two more 5-mm ports were placed, 1 in the left upper quadrant and second in the right upper quadrant.  There was incarcerated omentum and a 3 cm x 3 cm incisional hernia just above the umbilicus.  Harmonic scalpel was used to take this down carefully.  He had some oozing from being on Plavix which was minimal.  Once the entire defect was cleared for approximately 8 cm circumferentially.  I re-examined the abdominal cavity.  I saw no other evidence of injury to either of the bowel from the dissection or other organ.  I then made an incision, after marking the hernia defect with a marking pen of about 2 cm.  After opening this through the hernia defect itself, the rest of omentum incarcerated in the sac, I removed this.  I then used a 15 x 20  cm piece of the Parietex composite mesh, placing 4 anchoring sutures in each quadrant.  I rolled this up, placed it into the defect back in the abdominal cavity.  I then closed the defect with 0 Vicryl.  The subcutaneous tissues were closed with 1-0 Vicryl.  I then reinserted my scope and re-established the pneumoperitoneum to 15 mm of mercury of CO2.  The mesh was then deployed with the smooth side down towards the bowel.  A suture passer was used and all 4 sutures were pulled up, 1 superior, 1 inferior, 1 toward the right and left.  The defect was at least 6 cm of overlap.  These were tied down.  An absorbable tacker was used to tack the edge of the mesh circumferentially in 2 concentric circles.  Upon reinspection of the abdominal cavity, there was no  bleeding.  The bowel was uninjured upon inspection of all 4 quadrants.  The old blood was aspirated easily.  At this point in time, we allowed the CO2 to escape and removed our ports without difficulty.  Skin was closed with staples.  Dry dressings were applied.  All final counts of sponge, needle, and instruments were found to be correct at this portion case.  The patient was extubated taken recovery in satisfactory condition.     Kaisei Gilbo A. Monna Crean, M.D.     TAC/MEDQ  D:  09/25/2012  T:  09/26/2012  Job:  [id number]

## 2012-09-26 NOTE — Progress Notes (Signed)
1 Day Post-Op  Subjective: Pt feels ok.  Some voiding problems last night.  Objective: Vital signs in last 24 hours: Temp:  [97.2 F (36.2 C)-97.8 F (36.6 C)] 97.8 F (36.6 C) (01/08 0452) Pulse Rate:  [63-102] 68  (01/08 0452) Resp:  [13-23] 18  (01/08 0452) BP: (104-123)/(42-55) 105/55 mmHg (01/08 0452) SpO2:  [95 %-100 %] 98 % (01/08 0452) Last BM Date: 09/24/12  Intake/Output from previous day: 01/07 0701 - 01/08 0700 In: 2103 [P.O.:220; I.V.:1633; IV Piggyback:250] Out: 1150 [Urine:1100; Blood:50] Intake/Output this shift:    Incision/Wound:sites clean  Abdomen soft non distended.  Lab Results:   Basename 09/26/12 0540 09/25/12 1320  WBC 4.9 9.3  HGB 9.8* 10.0*  HCT 30.6* 30.9*  PLT 146* 145*   BMET  Basename 09/25/12 1320 09/25/12 0644  NA -- 139  K -- 4.4  CL -- 98  CO2 -- 34*  GLUCOSE -- 111*  BUN -- 55*  CREATININE 2.95* 3.03*  CALCIUM -- 9.6   PT/INR No results found for this basename: LABPROT:2,INR:2 in the last 72 hours ABG No results found for this basename: PHART:2,PCO2:2,PO2:2,HCO3:2 in the last 72 hours  Studies/Results: No results found.  Anti-infectives: Anti-infectives     Start     Dose/Rate Route Frequency Ordered Stop   09/25/12 1610   ceFAZolin (ANCEF) 3 g in dextrose 5 % 50 mL IVPB  Status:  Discontinued        3 g 160 mL/hr over 30 Minutes Intravenous On call to O.R. 09/25/12 0621 09/25/12 1230   09/25/12 0615   clindamycin (CLEOCIN) IVPB 300 mg        300 mg 100 mL/hr over 30 Minutes Intravenous  Once 09/25/12 0605 09/25/12 0746          Assessment/Plan: s/p Procedure(s) (LRB) with comments: LAPAROSCOPIC VENTRAL HERNIA (N/A) - laparoscopic incisional hernia repair with mesh INSERTION OF MESH (N/A) Discharge  LOS: 1 day    Cymone Yeske A. 09/26/2012

## 2012-09-26 NOTE — Progress Notes (Signed)
0130 Patient unable to void since foley was removed at 1230. Patient had been scan x2 was <300. Patient I&O cath for 600 ml clear yellow urine. Will continue to monitor

## 2012-09-26 NOTE — Discharge Summary (Signed)
Physician Discharge Summary  Patient ID: RANSOM NICKSON MRN: [id number] DOB/AGE: 1923-06-15 79 y.o.  Admit date: 09/25/2012 Discharge date: 09/26/2012  Admission Diagnoses: incisional hernia  Discharge Diagnoses: same Active Problems:  * No active hospital problems. *    Discharged Condition: good  Hospital Course: Uneventful.  Mild urinary retension but  Resolved.    Consults: None  Significant Diagnostic Studies: none  Treatments: surgery: lap incisional hernia repair with mesh  Discharge Exam: Blood pressure 105/55, pulse 68, temperature 97.8 F (36.6 C), temperature source Oral, resp. rate 18, SpO2 98.00%. Incision/Wound:soft non distended wounds clean intact.   Disposition: 01-Home or Self Care     Medication List     As of 09/26/2012  8:02 AM    ASK your doctor about these medications         aspirin EC 81 MG tablet   Take 81 mg by mouth daily.      atenolol 50 MG tablet   Commonly known as: TENORMIN   Take 25 mg by mouth daily.      cholecalciferol 1000 UNITS tablet   Commonly known as: VITAMIN D   Take 1,000 Units by mouth daily with breakfast.      clopidogrel 75 MG tablet   Commonly known as: PLAVIX   Take 75 mg by mouth daily.      colchicine 0.6 MG tablet   Take 0.6 mg by mouth 2 (two) times daily as needed. For gout flare      CoQ-10 100 MG Caps   Take 1 capsule by mouth daily with breakfast.      furosemide 40 MG tablet   Commonly known as: LASIX   Take 20-40 mg by mouth daily.      HYDROcodone-acetaminophen 10-325 MG per tablet   Commonly known as: NORCO   Take 1 tablet by mouth every 6 (six) hours as needed. For pain      levothyroxine 75 MCG tablet   Commonly known as: SYNTHROID, LEVOTHROID   Take 75 mcg by mouth daily.      nitroGLYCERIN 0.4 MG SL tablet   Commonly known as: NITROSTAT   Place 0.4 mg under the tongue every 5 (five) minutes as needed. For chest pain      pantoprazole 20 MG tablet   Commonly known as: PROTONIX   Take 20 mg by mouth daily.      simvastatin 20 MG tablet   Commonly known as: ZOCOR   Take 1 tablet (20 mg total) by mouth daily at 6 PM.      Tamsulosin HCl 0.4 MG Caps   Commonly known as: FLOMAX   Take 0.4 mg by mouth at bedtime.           Follow-up Information    Follow up with Makenzi Bannister A., MD. In 1 week.   Contact information:   [address] [place] [address] [phone]          Signed: Dortha Schwalbe. 09/26/2012, 8:02 AM

## 2012-09-28 ENCOUNTER — Encounter (INDEPENDENT_AMBULATORY_CARE_PROVIDER_SITE_OTHER): Payer: Self-pay | Admitting: Surgery

## 2012-09-28 ENCOUNTER — Ambulatory Visit (INDEPENDENT_AMBULATORY_CARE_PROVIDER_SITE_OTHER): Payer: Medicare Other | Admitting: Surgery

## 2012-09-28 VITALS — BP 114/62 | HR 80 | Resp 14 | Ht 67.0 in | Wt 158.8 lb

## 2012-09-28 DIAGNOSIS — Z9889 Other specified postprocedural states: Secondary | ICD-10-CM

## 2012-09-28 NOTE — Progress Notes (Signed)
Patient returns today following laparoscopic ventral hernia repair. He a urinary retention a Foley catheter is placed  2 days ago. He is having some constipation.  Exam: Laparoscopic port sites clean dry and intact. Foley removed today without difficulty the office.  Impression: Status post laparoscopic ventral hernia repair postop day 3 with postoperative urinary retention treated by catheter  Plan: Catheter was removed and he will continue on Flomax. If he has further problems he may require urological evaluation. Return one week for staple removal. Increase MiraLAX as needed for bowel movement.

## 2012-09-28 NOTE — Patient Instructions (Signed)
Increase Miralax and ad MOM if needed for BM.  Return next week for staples.

## 2012-10-05 ENCOUNTER — Ambulatory Visit (INDEPENDENT_AMBULATORY_CARE_PROVIDER_SITE_OTHER): Payer: Medicare Other | Admitting: Surgery

## 2012-10-05 ENCOUNTER — Encounter (INDEPENDENT_AMBULATORY_CARE_PROVIDER_SITE_OTHER): Payer: Self-pay | Admitting: Surgery

## 2012-10-05 VITALS — BP 140/72 | HR 56 | Temp 97.4°F | Resp 16 | Ht 67.0 in | Wt 151.8 lb

## 2012-10-05 DIAGNOSIS — Z9889 Other specified postprocedural states: Secondary | ICD-10-CM

## 2012-10-05 NOTE — Patient Instructions (Signed)
Resume driving.  Return as needed

## 2012-10-05 NOTE — Progress Notes (Signed)
Patient returns if laparoscopic incisional hernia repair. He is sore but doing okay. Voiding well. Has had some episodes of what sounds like a racing heartbeat while at home but this is resolved. He is on a beta blocker. He is short of breath with exertion. Denies any chest pain today.  Exam: Significant ecchymoses involving right lower quadrant abdomen. Staples removed. No signs of infection .Sinus rhythm  Rate 56  Impression: Status post laparoscopic repair of incisional hernia  Plan: Resume full duties tolerated. I recommended following up with his cardiologist about his heart issues. Followup when necessary.

## 2012-10-11 ENCOUNTER — Encounter (INDEPENDENT_AMBULATORY_CARE_PROVIDER_SITE_OTHER): Payer: Self-pay

## 2012-11-04 ENCOUNTER — Encounter (HOSPITAL_BASED_OUTPATIENT_CLINIC_OR_DEPARTMENT_OTHER): Payer: Self-pay

## 2012-11-04 ENCOUNTER — Emergency Department (HOSPITAL_BASED_OUTPATIENT_CLINIC_OR_DEPARTMENT_OTHER)
Admission: EM | Admit: 2012-11-04 | Discharge: 2012-11-04 | Disposition: A | Discharge status: Home / Self Care | Payer: Medicare Other | Attending: Emergency Medicine | Admitting: Emergency Medicine

## 2012-11-04 DIAGNOSIS — Z7982 Long term (current) use of aspirin: Secondary | ICD-10-CM | POA: Insufficient documentation

## 2012-11-04 DIAGNOSIS — M109 Gout, unspecified: Secondary | ICD-10-CM | POA: Insufficient documentation

## 2012-11-04 DIAGNOSIS — Z8739 Personal history of other diseases of the musculoskeletal system and connective tissue: Secondary | ICD-10-CM | POA: Insufficient documentation

## 2012-11-04 DIAGNOSIS — Z87891 Personal history of nicotine dependence: Secondary | ICD-10-CM | POA: Insufficient documentation

## 2012-11-04 DIAGNOSIS — K219 Gastro-esophageal reflux disease without esophagitis: Secondary | ICD-10-CM | POA: Insufficient documentation

## 2012-11-04 DIAGNOSIS — I509 Heart failure, unspecified: Secondary | ICD-10-CM | POA: Insufficient documentation

## 2012-11-04 DIAGNOSIS — Z87448 Personal history of other diseases of urinary system: Secondary | ICD-10-CM | POA: Insufficient documentation

## 2012-11-04 DIAGNOSIS — I129 Hypertensive chronic kidney disease with stage 1 through stage 4 chronic kidney disease, or unspecified chronic kidney disease: Secondary | ICD-10-CM | POA: Insufficient documentation

## 2012-11-04 DIAGNOSIS — Z8711 Personal history of peptic ulcer disease: Secondary | ICD-10-CM | POA: Insufficient documentation

## 2012-11-04 DIAGNOSIS — R300 Dysuria: Secondary | ICD-10-CM | POA: Insufficient documentation

## 2012-11-04 DIAGNOSIS — Z7902 Long term (current) use of antithrombotics/antiplatelets: Secondary | ICD-10-CM | POA: Insufficient documentation

## 2012-11-04 DIAGNOSIS — N184 Chronic kidney disease, stage 4 (severe): Secondary | ICD-10-CM | POA: Insufficient documentation

## 2012-11-04 DIAGNOSIS — Z9861 Coronary angioplasty status: Secondary | ICD-10-CM | POA: Insufficient documentation

## 2012-11-04 DIAGNOSIS — E785 Hyperlipidemia, unspecified: Secondary | ICD-10-CM | POA: Insufficient documentation

## 2012-11-04 DIAGNOSIS — N3090 Cystitis, unspecified without hematuria: Secondary | ICD-10-CM | POA: Insufficient documentation

## 2012-11-04 DIAGNOSIS — Z79899 Other long term (current) drug therapy: Secondary | ICD-10-CM | POA: Insufficient documentation

## 2012-11-04 DIAGNOSIS — Z8719 Personal history of other diseases of the digestive system: Secondary | ICD-10-CM | POA: Insufficient documentation

## 2012-11-04 DIAGNOSIS — Z8673 Personal history of transient ischemic attack (TIA), and cerebral infarction without residual deficits: Secondary | ICD-10-CM | POA: Insufficient documentation

## 2012-11-04 DIAGNOSIS — R319 Hematuria, unspecified: Secondary | ICD-10-CM | POA: Insufficient documentation

## 2012-11-04 DIAGNOSIS — I2510 Atherosclerotic heart disease of native coronary artery without angina pectoris: Secondary | ICD-10-CM | POA: Insufficient documentation

## 2012-11-04 DIAGNOSIS — E039 Hypothyroidism, unspecified: Secondary | ICD-10-CM | POA: Insufficient documentation

## 2012-11-04 LAB — CBC WITH DIFFERENTIAL/PLATELET
Eosinophils Absolute: 0.1 10*3/uL (ref 0.0–0.7)
Hemoglobin: 10 g/dL — ABNORMAL LOW (ref 13.0–17.0)
Lymphs Abs: 1.1 10*3/uL (ref 0.7–4.0)
MCH: 29.9 pg (ref 26.0–34.0)
MCV: 93.1 fL (ref 78.0–100.0)
Monocytes Relative: 10 % (ref 3–12)
Neutrophils Relative %: 69 % (ref 43–77)
RBC: 3.35 MIL/uL — ABNORMAL LOW (ref 4.22–5.81)

## 2012-11-04 LAB — URINALYSIS, ROUTINE W REFLEX MICROSCOPIC
Bilirubin Urine: NEGATIVE
Specific Gravity, Urine: 1.008 (ref 1.005–1.030)
pH: 6 (ref 5.0–8.0)

## 2012-11-04 LAB — BASIC METABOLIC PANEL
BUN: 52 mg/dL — ABNORMAL HIGH (ref 6–23)
CO2: 30 mEq/L (ref 19–32)
GFR calc non Af Amer: 20 mL/min — ABNORMAL LOW (ref >90)
Glucose, Bld: 105 mg/dL — ABNORMAL HIGH (ref 70–99)
Potassium: 4.1 mEq/L (ref 3.5–5.1)

## 2012-11-04 LAB — URINE MICROSCOPIC-ADD ON

## 2012-11-04 MED ORDER — CIPROFLOXACIN HCL 500 MG PO TABS: 250.0000 mg | ORAL_TABLET | Freq: Two times a day (BID) | ORAL | Status: DC

## 2012-11-04 MED ORDER — TRAMADOL HCL 50 MG PO TABS
50.0000 mg | ORAL_TABLET | Freq: Four times a day (QID) | ORAL | Status: DC | PRN
Start: 2012-11-04 — End: 2013-06-12

## 2012-11-04 NOTE — ED Notes (Signed)
Pt states that he has had hematuria for the past four days, dysuria, and states that he now has abdominal pain.  Pt states that he also had hernia repair about 2-3 weeks ago.

## 2012-11-04 NOTE — ED Provider Notes (Signed)
History  This chart was scribed for Loren Racer, MD by Bennett Scrape, ED Scribe. This patient was seen in room MH04/MH04 and the patient's care was started at 11:39 AM.  CSN: [id number]  Arrival date & time 11/04/12  1056   First MD Initiated Contact with Patient 11/04/12 1139      Chief Complaint  Patient presents with  . Abdominal Pain  . Dysuria    Patient is a 79 y.o. male presenting with hematuria. The history is provided by the patient. No language interpreter was used.  Hematuria This is a new problem. The current episode started more than 2 days ago. The problem occurs constantly. The problem has not changed since onset.Associated symptoms include abdominal pain. Nothing aggravates the symptoms. Nothing relieves the symptoms. He has tried nothing for the symptoms.    [name] is a 79 y.o. male who presents to the Emergency Department complaining of 4 days of sudden onset, non-changing, constant hematuria described as pink colored with associated suprapubic abdominal pain, dysuria described as sharp and decreased urine output. Pt had a hernia repair performed by Dr. Luisa Hart 6 weeks ago and reports LLQ abdominal pain associated with the surgery. He denies similarities between the suprapubic and LLQ abdominal pain. He denies taking OTC medications at home to improve symptoms.  He denies fevers, chills, nausea and emesis. He has a h/o stage 4 renal disease but denies being on dialysis currently. He also has a h/o CAD, HTN, GERD, CHF and CVA. He is an occasional alcohol user and is a former smoker.  Dr. Allena Katz is endrocrinologist  Past Medical History  Diagnosis Date  . CAD (coronary artery disease)   . HTN (hypertension)   . Peptic ulcer disease   . BPH (benign prostatic hyperplasia)   . Insomnia   . GERD (gastroesophageal reflux disease)   . CHF (congestive heart failure)   . Hyperlipidemia   . Hypothyroidism   . H/O hiatal hernia   . Osteoarthritis     GOUT  .  DDD (degenerative disc disease), lumbar   . Exertional shortness of breath     "@ times" (09/25/2012)  . History of blood transfusion     "related to bleeding ulcers" (09/25/2012)  . Lower GI bleeding ?2009    "once; had it cauterized" (09/25/2012)  . Stroke ?2003    denies residual (09/25/2012)  . Chronic lower back pain   . Gout     "once in awhile" (09/25/2012)  . Chronic kidney disease (CKD), stage IV (severe)     Past Surgical History  Procedure Laterality Date  . Vagotomy and pyloroplasty  1970's?    "bleeding ulcers" (09/25/2012)  . S/p decompressive laminectomy,medial facetectomy and forminotomy l4-l5    . Great toe chilectomy left  ?1970's  . Hemoclipped gastric ulcer  2009  . Lumbar epidural steroid injection l2-3  2010  . Coronary angioplasty with stent placement  09/20/1997; ~ 78295    "2 + 2; total of 4" (09/25/2012)DR H SMITH   . Cataract extraction w/ intraocular lens implant  ~ 2009    RIGHT EYE  . Hernia repair  09/25/2012    VHR w/mesh (09/25/2012)  . Toe surgery      "cut one of my toes off and had it reattached; ? toe" (09/25/2012)  . Brow lift  2000's    "both" (09/25/2012)  . Dupuytren contracture release       right palm/notes (09/25/2012)  . Ventral hernia repair  09/25/2012  Procedure: LAPAROSCOPIC VENTRAL HERNIA;  Surgeon: Clovis Pu. Cornett, MD;  Location: [facility] OR;  Service: General;  Laterality: N/A;  laparoscopic incisional hernia repair with mesh  . Insertion of mesh  09/25/2012    Procedure: INSERTION OF MESH;  Surgeon: Clovis Pu. Cornett, MD;  Location: [facility] OR;  Service: General;  Laterality: N/A;    History reviewed. No pertinent family history.  History  Substance Use Topics  . Smoking status: Former Smoker -- 0.12 packs/day for 5 years    Types: Cigarettes    Quit date: 07/20/1954  . Smokeless tobacco: Never Used  . Alcohol Use: Yes     Comment: 09/25/2012 "last alcohol was in 1949; have had a drink or 2"      Review of Systems  Constitutional: Negative for  fever and chills.  Gastrointestinal: Positive for abdominal pain. Negative for nausea, vomiting and diarrhea.  Genitourinary: Positive for dysuria and hematuria. Negative for urgency and frequency.  All other systems reviewed and are negative.    Allergies  Penicillins; Allopurinol; and Benicar  Home Medications   Current Outpatient Rx  Name  Route  Sig  Dispense  Refill  . aspirin EC 81 MG tablet   Oral   Take 81 mg by mouth daily.         Marland Kitchen atenolol (TENORMIN) 50 MG tablet   Oral   Take 25 mg by mouth daily.         . cholecalciferol (VITAMIN D) 1000 UNITS tablet   Oral   Take 1,000 Units by mouth daily with breakfast.          . clopidogrel (PLAVIX) 75 MG tablet   Oral   Take 1 tablet (75 mg total) by mouth daily.   30 tablet   0     Restart on friday   . Coenzyme Q10 (COQ-10) 100 MG CAPS   Oral   Take 1 capsule by mouth daily with breakfast.          . colchicine 0.6 MG tablet   Oral   Take 0.6 mg by mouth 2 (two) times daily as needed. For gout flare         . furosemide (LASIX) 40 MG tablet   Oral   Take 20-40 mg by mouth daily.          Marland Kitchen levothyroxine (SYNTHROID, LEVOTHROID) 75 MCG tablet   Oral   Take 75 mcg by mouth daily.          . nitroGLYCERIN (NITROSTAT) 0.4 MG SL tablet   Sublingual   Place 0.4 mg under the tongue every 5 (five) minutes as needed. For chest pain         . pantoprazole (PROTONIX) 20 MG tablet   Oral   Take 20 mg by mouth daily.         . polyethylene glycol (MIRALAX / GLYCOLAX) packet   Oral   Take 17 g by mouth daily.   14 each   0   . simvastatin (ZOCOR) 20 MG tablet   Oral   Take 1 tablet (20 mg total) by mouth daily at 6 PM.   30 tablet   11   . Tamsulosin HCl (FLOMAX) 0.4 MG CAPS   Oral   Take 0.4 mg by mouth at bedtime.          Marland Kitchen HYDROcodone-acetaminophen (NORCO) 10-325 MG per tablet   Oral   Take 1 tablet by mouth every 6 (six) hours as needed.  30 tablet   0     Triage  Vitals: BP 134/64  Pulse 64  Temp(Src) 98 F (36.7 C) (Oral)  Resp 20  Ht 5\' 7"  (1.702 m)  Wt 140 lb (63.504 kg)  BMI 21.92 kg/m2  SpO2 97%  Physical Exam  Nursing note and vitals reviewed. Constitutional: He is oriented to person, place, and time. He appears well-developed and well-nourished. No distress.  HENT:  Head: Normocephalic and atraumatic.  Eyes: Conjunctivae and EOM are normal.  Neck: Neck supple. No tracheal deviation present.  Cardiovascular: Normal rate, regular rhythm and intact distal pulses.   No murmur heard. Pulmonary/Chest: Effort normal and breath sounds normal. No respiratory distress.  Abdominal: Soft. There is tenderness. There is no rebound and no guarding.  Suprapubic abdominal tenderness to palpation, mild LLQ tenderness to palpation consistent with hernia repair surgery  Musculoskeletal: Normal range of motion. He exhibits no edema.  Neurological: He is alert and oriented to person, place, and time.  Skin: Skin is warm and dry.  Psychiatric: He has a normal mood and affect. His behavior is normal.    ED Course  Procedures (including critical care time)  DIAGNOSTIC STUDIES: Oxygen Saturation is 97% on room air, adequate by my interpretation.    COORDINATION OF CARE: 12:00 PM-Discussed treatment plan which includes CBC panel and UA with pt at bedside and pt agreed to plan.   1:58 PM-Pt rechecked and is resting comfortably. Informed pt of UA results and discussed discharge plan. Pt is agreeable to this plan.  Labs Reviewed  URINALYSIS, ROUTINE W REFLEX MICROSCOPIC - Abnormal; Notable for the following:    APPearance CLOUDY (*)    Hgb urine dipstick SMALL (*)    Protein, ur 30 (*)    Leukocytes, UA LARGE (*)    All other components within normal limits  URINE MICROSCOPIC-ADD ON - Abnormal; Notable for the following:    Bacteria, UA MANY (*)    All other components within normal limits  CBC WITH DIFFERENTIAL - Abnormal; Notable for the  following:    RBC 3.35 (*)    Hemoglobin 10.0 (*)    HCT 31.2 (*)    All other components within normal limits  BASIC METABOLIC PANEL - Abnormal; Notable for the following:    Glucose, Bld 105 (*)    BUN 52 (*)    Creatinine, Ser 2.70 (*)    GFR calc non Af Amer 20 (*)    GFR calc Af Amer 23 (*)    All other components within normal limits  URINE CULTURE   No results found.   1. Cystitis       MDM  I personally performed the services described in this documentation, which was scribed in my presence. The recorded information has been reviewed and is accurate.       Loren Racer, MD 11/07/12 [phone]

## 2012-11-06 LAB — URINE CULTURE

## 2012-11-08 NOTE — ED Notes (Signed)
+   Urine Patient treated with Cipro-sensitive to same-chart appended per protocol MD. 

## 2012-11-15 ENCOUNTER — Encounter (HOSPITAL_BASED_OUTPATIENT_CLINIC_OR_DEPARTMENT_OTHER): Payer: Self-pay | Admitting: *Deleted

## 2012-11-15 ENCOUNTER — Emergency Department (HOSPITAL_BASED_OUTPATIENT_CLINIC_OR_DEPARTMENT_OTHER): Payer: Medicare Other

## 2012-11-15 ENCOUNTER — Emergency Department (HOSPITAL_BASED_OUTPATIENT_CLINIC_OR_DEPARTMENT_OTHER)
Admission: EM | Admit: 2012-11-15 | Discharge: 2012-11-15 | Disposition: A | Discharge status: Home / Self Care | Payer: Medicare Other | Attending: Emergency Medicine | Admitting: Emergency Medicine

## 2012-11-15 DIAGNOSIS — Z862 Personal history of diseases of the blood and blood-forming organs and certain disorders involving the immune mechanism: Secondary | ICD-10-CM | POA: Insufficient documentation

## 2012-11-15 DIAGNOSIS — Z8711 Personal history of peptic ulcer disease: Secondary | ICD-10-CM | POA: Insufficient documentation

## 2012-11-15 DIAGNOSIS — Z9861 Coronary angioplasty status: Secondary | ICD-10-CM | POA: Insufficient documentation

## 2012-11-15 DIAGNOSIS — I509 Heart failure, unspecified: Secondary | ICD-10-CM | POA: Insufficient documentation

## 2012-11-15 DIAGNOSIS — N400 Enlarged prostate without lower urinary tract symptoms: Secondary | ICD-10-CM | POA: Insufficient documentation

## 2012-11-15 DIAGNOSIS — Z7982 Long term (current) use of aspirin: Secondary | ICD-10-CM | POA: Insufficient documentation

## 2012-11-15 DIAGNOSIS — Z87891 Personal history of nicotine dependence: Secondary | ICD-10-CM | POA: Insufficient documentation

## 2012-11-15 DIAGNOSIS — G8929 Other chronic pain: Secondary | ICD-10-CM | POA: Insufficient documentation

## 2012-11-15 DIAGNOSIS — Y929 Unspecified place or not applicable: Secondary | ICD-10-CM | POA: Insufficient documentation

## 2012-11-15 DIAGNOSIS — Y9301 Activity, walking, marching and hiking: Secondary | ICD-10-CM | POA: Insufficient documentation

## 2012-11-15 DIAGNOSIS — Z23 Encounter for immunization: Secondary | ICD-10-CM | POA: Insufficient documentation

## 2012-11-15 DIAGNOSIS — M545 Low back pain, unspecified: Secondary | ICD-10-CM | POA: Insufficient documentation

## 2012-11-15 DIAGNOSIS — Z8719 Personal history of other diseases of the digestive system: Secondary | ICD-10-CM | POA: Insufficient documentation

## 2012-11-15 DIAGNOSIS — S61409A Unspecified open wound of unspecified hand, initial encounter: Secondary | ICD-10-CM | POA: Insufficient documentation

## 2012-11-15 DIAGNOSIS — I129 Hypertensive chronic kidney disease with stage 1 through stage 4 chronic kidney disease, or unspecified chronic kidney disease: Secondary | ICD-10-CM | POA: Insufficient documentation

## 2012-11-15 DIAGNOSIS — S0990XA Unspecified injury of head, initial encounter: Secondary | ICD-10-CM | POA: Insufficient documentation

## 2012-11-15 DIAGNOSIS — E785 Hyperlipidemia, unspecified: Secondary | ICD-10-CM | POA: Insufficient documentation

## 2012-11-15 DIAGNOSIS — Z8739 Personal history of other diseases of the musculoskeletal system and connective tissue: Secondary | ICD-10-CM | POA: Insufficient documentation

## 2012-11-15 DIAGNOSIS — S0181XA Laceration without foreign body of other part of head, initial encounter: Secondary | ICD-10-CM

## 2012-11-15 DIAGNOSIS — N184 Chronic kidney disease, stage 4 (severe): Secondary | ICD-10-CM | POA: Insufficient documentation

## 2012-11-15 DIAGNOSIS — W1809XA Striking against other object with subsequent fall, initial encounter: Secondary | ICD-10-CM | POA: Insufficient documentation

## 2012-11-15 DIAGNOSIS — Z8673 Personal history of transient ischemic attack (TIA), and cerebral infarction without residual deficits: Secondary | ICD-10-CM | POA: Insufficient documentation

## 2012-11-15 DIAGNOSIS — M109 Gout, unspecified: Secondary | ICD-10-CM | POA: Insufficient documentation

## 2012-11-15 DIAGNOSIS — K219 Gastro-esophageal reflux disease without esophagitis: Secondary | ICD-10-CM | POA: Insufficient documentation

## 2012-11-15 DIAGNOSIS — Z7902 Long term (current) use of antithrombotics/antiplatelets: Secondary | ICD-10-CM | POA: Insufficient documentation

## 2012-11-15 DIAGNOSIS — E039 Hypothyroidism, unspecified: Secondary | ICD-10-CM | POA: Insufficient documentation

## 2012-11-15 DIAGNOSIS — Z79899 Other long term (current) drug therapy: Secondary | ICD-10-CM | POA: Insufficient documentation

## 2012-11-15 DIAGNOSIS — Z8639 Personal history of other endocrine, nutritional and metabolic disease: Secondary | ICD-10-CM | POA: Insufficient documentation

## 2012-11-15 DIAGNOSIS — S0180XA Unspecified open wound of other part of head, initial encounter: Secondary | ICD-10-CM | POA: Insufficient documentation

## 2012-11-15 DIAGNOSIS — I2510 Atherosclerotic heart disease of native coronary artery without angina pectoris: Secondary | ICD-10-CM | POA: Insufficient documentation

## 2012-11-15 MED ORDER — TETANUS-DIPHTH-ACELL PERTUSSIS 5-2.5-18.5 LF-MCG/0.5 IM SUSP
0.5000 mL | Freq: Once | INTRAMUSCULAR | Status: AC
  Administered 2012-11-15: 0.5 mL via INTRAMUSCULAR
  Filled 2012-11-15: qty 0.5

## 2012-11-15 MED ORDER — LIDOCAINE-EPINEPHRINE 2 %-1:100000 IJ SOLN
INTRAMUSCULAR | Status: AC
  Administered 2012-11-15: 19:00:00
  Filled 2012-11-15: qty 1

## 2012-11-15 NOTE — ED Provider Notes (Signed)
History     CSN: [id number]  Arrival date & time 11/15/12  1634   First MD Initiated Contact with Patient 11/15/12 1650      Chief Complaint  Patient presents with  . Fall    (Consider location/radiation/quality/duration/timing/severity/associated sxs/prior treatment) HPI Comments: Patient with history of CAD, HTN on plavix.  Was walking dogs who knocked him down causing him to strike his head on the ground.  He denies loc but does reports headache and laceration to the right side of the forehead.  No neck pain.  Has skin tears to the left hand but denies any other injury.  Patient is a 79 y.o. male presenting with fall. The history is provided by the patient.  Fall The accident occurred less than 1 hour ago. The fall occurred while walking. He fell from a height of 1 to 2 ft. He landed on concrete. The volume of blood lost was moderate. The point of impact was the head (left hand). The pain is present in the head (left hand). The pain is moderate. Associated symptoms include headaches. Pertinent negatives include no visual change, no abdominal pain and no vomiting. He has tried nothing for the symptoms.    Past Medical History  Diagnosis Date  . CAD (coronary artery disease)   . HTN (hypertension)   . Peptic ulcer disease   . BPH (benign prostatic hyperplasia)   . Insomnia   . GERD (gastroesophageal reflux disease)   . CHF (congestive heart failure)   . Hyperlipidemia   . Hypothyroidism   . H/O hiatal hernia   . Osteoarthritis     GOUT  . DDD (degenerative disc disease), lumbar   . Exertional shortness of breath     "@ times" (09/25/2012)  . History of blood transfusion     "related to bleeding ulcers" (09/25/2012)  . Lower GI bleeding ?2009    "once; had it cauterized" (09/25/2012)  . Stroke ?2003    denies residual (09/25/2012)  . Chronic lower back pain   . Gout     "once in awhile" (09/25/2012)  . Chronic kidney disease (CKD), stage IV (severe)     Past Surgical History   Procedure Laterality Date  . Vagotomy and pyloroplasty  1970's?    "bleeding ulcers" (09/25/2012)  . S/p decompressive laminectomy,medial facetectomy and forminotomy l4-l5    . Great toe chilectomy left  ?1970's  . Hemoclipped gastric ulcer  2009  . Lumbar epidural steroid injection l2-3  2010  . Coronary angioplasty with stent placement  09/20/1997; ~ 40981    "2 + 2; total of 4" (09/25/2012)DR H SMITH   . Cataract extraction w/ intraocular lens implant  ~ 2009    RIGHT EYE  . Hernia repair  09/25/2012    VHR w/mesh (09/25/2012)  . Toe surgery      "cut one of my toes off and had it reattached; ? toe" (09/25/2012)  . Brow lift  2000's    "both" (09/25/2012)  . Dupuytren contracture release       right palm/notes (09/25/2012)  . Ventral hernia repair  09/25/2012    Procedure: LAPAROSCOPIC VENTRAL HERNIA;  Surgeon: Clovis Pu. Cornett, MD;  Location: [facility] OR;  Service: General;  Laterality: N/A;  laparoscopic incisional hernia repair with mesh  . Insertion of mesh  09/25/2012    Procedure: INSERTION OF MESH;  Surgeon: Clovis Pu. Cornett, MD;  Location: [facility] OR;  Service: General;  Laterality: N/A;    No family history on file.  History  Substance Use Topics  . Smoking status: Former Smoker -- 0.12 packs/day for 5 years    Types: Cigarettes    Quit date: 07/20/1954  . Smokeless tobacco: Never Used  . Alcohol Use: Yes     Comment: 09/25/2012 "last alcohol was in 1949; have had a drink or 2"      Review of Systems  Gastrointestinal: Negative for vomiting and abdominal pain.  Neurological: Positive for headaches.  All other systems reviewed and are negative.    Allergies  Penicillins; Allopurinol; and Benicar  Home Medications   Current Outpatient Rx  Name  Route  Sig  Dispense  Refill  . aspirin EC 81 MG tablet   Oral   Take 81 mg by mouth daily.         Marland Kitchen atenolol (TENORMIN) 50 MG tablet   Oral   Take 25 mg by mouth daily.         . cholecalciferol (VITAMIN D) 1000 UNITS tablet    Oral   Take 1,000 Units by mouth daily with breakfast.          . ciprofloxacin (CIPRO) 500 MG tablet   Oral   Take 0.5 tablets (250 mg total) by mouth 2 (two) times daily.   14 tablet   0   . clopidogrel (PLAVIX) 75 MG tablet   Oral   Take 1 tablet (75 mg total) by mouth daily.   30 tablet   0     Restart on friday   . Coenzyme Q10 (COQ-10) 100 MG CAPS   Oral   Take 1 capsule by mouth daily with breakfast.          . colchicine 0.6 MG tablet   Oral   Take 0.6 mg by mouth 2 (two) times daily as needed. For gout flare         . furosemide (LASIX) 40 MG tablet   Oral   Take 20-40 mg by mouth daily.          Marland Kitchen HYDROcodone-acetaminophen (NORCO) 10-325 MG per tablet   Oral   Take 1 tablet by mouth every 6 (six) hours as needed.   30 tablet   0   . levothyroxine (SYNTHROID, LEVOTHROID) 75 MCG tablet   Oral   Take 75 mcg by mouth daily.          . nitroGLYCERIN (NITROSTAT) 0.4 MG SL tablet   Sublingual   Place 0.4 mg under the tongue every 5 (five) minutes as needed. For chest pain         . pantoprazole (PROTONIX) 20 MG tablet   Oral   Take 20 mg by mouth daily.         . polyethylene glycol (MIRALAX / GLYCOLAX) packet   Oral   Take 17 g by mouth daily.   14 each   0   . simvastatin (ZOCOR) 20 MG tablet   Oral   Take 1 tablet (20 mg total) by mouth daily at 6 PM.   30 tablet   11   . Tamsulosin HCl (FLOMAX) 0.4 MG CAPS   Oral   Take 0.4 mg by mouth at bedtime.          . traMADol (ULTRAM) 50 MG tablet   Oral   Take 1 tablet (50 mg total) by mouth every 6 (six) hours as needed for pain.   15 tablet   0     BP 124/51  Pulse 69  Temp(Src) 97.9  F (36.6 C) (Oral)  Resp 22  SpO2 99%  Physical Exam  Nursing note and vitals reviewed. Constitutional: He is oriented to person, place, and time. He appears well-developed and well-nourished. No distress.  HENT:  Head: Normocephalic.  Mouth/Throat: Oropharynx is clear and moist.  There  is a Y-shaped laceration to the right forehead that is total of 5 cm, and a second laceration that is about 2.5 cm in length.    Neck: Normal range of motion. Neck supple.  No c-spine ttp and painless range of motion in all directions.    Cardiovascular: Normal rate and regular rhythm.   No murmur heard. Pulmonary/Chest: Effort normal and breath sounds normal.  Abdominal: Soft. Bowel sounds are normal.  Musculoskeletal: Normal range of motion. He exhibits no edema.  There are jagged skin tears to the dorsum of the left hand at the knuckles.    Neurological: He is alert and oriented to person, place, and time. No cranial nerve deficit. He exhibits normal muscle tone. Coordination normal.  Skin: Skin is warm and dry. He is not diaphoretic.    ED Course  Procedures (including critical care time)  Labs Reviewed - No data to display No results found.   No diagnosis found.    MDM  The patient presents with forehead and hand lacerations after a fall.  The ct is negative for ich and the hand xray is negative for fracture.  There were fbs potentially on the xray of the hand.  This was cleaned and irrigated but no sutures placed as the wounds were mainly skin tears.  The forehead lacerations were closed.  LACERATION REPAIR Performed by: Geoffery Lyons Authorized by: Geoffery Lyons Consent: Verbal consent obtained. Risks and benefits: risks, benefits and alternatives were discussed Consent given by: patient Patient identity confirmed: provided demographic data Prepped and Draped in normal sterile fashion Wound explored  Laceration Location: right forehead  Laceration Length: 5cm, 2.5 cm  No Foreign Bodies seen or palpated  Anesthesia: local infiltration  Local anesthetic: lidocaine 1% with epinephrine  Anesthetic total: 3 ml  Irrigation method: syringe Amount of cleaning: standard  Skin closure: 5-0 ethilon  Number of sutures: 5,3  Technique: simple interrupted  Patient  tolerance: Patient tolerated the procedure well with no immediate complications.   Tetanus updated, sutures out in one week.  Return prn for signs or symptoms of infection.           Geoffery Lyons, MD 11/15/12 [phone]

## 2012-11-15 NOTE — ED Notes (Addendum)
Patient transported to X-ray & CT °

## 2012-11-15 NOTE — ED Notes (Signed)
Patient assisted in getting dressed & ready for discharge.

## 2012-11-15 NOTE — ED Notes (Signed)
Tripped and fell landing on concrete. Laceration to his forehead.  Multiple lacerations to his left hand. He is on blood thinners.

## 2012-11-22 ENCOUNTER — Encounter (HOSPITAL_BASED_OUTPATIENT_CLINIC_OR_DEPARTMENT_OTHER): Payer: Self-pay

## 2012-11-22 ENCOUNTER — Emergency Department (HOSPITAL_BASED_OUTPATIENT_CLINIC_OR_DEPARTMENT_OTHER)
Admission: EM | Admit: 2012-11-22 | Discharge: 2012-11-22 | Disposition: A | Discharge status: Home / Self Care | Payer: Medicare Other | Attending: Emergency Medicine | Admitting: Emergency Medicine

## 2012-11-22 DIAGNOSIS — N184 Chronic kidney disease, stage 4 (severe): Secondary | ICD-10-CM | POA: Insufficient documentation

## 2012-11-22 DIAGNOSIS — Z7982 Long term (current) use of aspirin: Secondary | ICD-10-CM | POA: Insufficient documentation

## 2012-11-22 DIAGNOSIS — E039 Hypothyroidism, unspecified: Secondary | ICD-10-CM | POA: Insufficient documentation

## 2012-11-22 DIAGNOSIS — I129 Hypertensive chronic kidney disease with stage 1 through stage 4 chronic kidney disease, or unspecified chronic kidney disease: Secondary | ICD-10-CM | POA: Insufficient documentation

## 2012-11-22 DIAGNOSIS — I509 Heart failure, unspecified: Secondary | ICD-10-CM | POA: Insufficient documentation

## 2012-11-22 DIAGNOSIS — Z8679 Personal history of other diseases of the circulatory system: Secondary | ICD-10-CM | POA: Insufficient documentation

## 2012-11-22 DIAGNOSIS — Z8673 Personal history of transient ischemic attack (TIA), and cerebral infarction without residual deficits: Secondary | ICD-10-CM | POA: Insufficient documentation

## 2012-11-22 DIAGNOSIS — E785 Hyperlipidemia, unspecified: Secondary | ICD-10-CM | POA: Insufficient documentation

## 2012-11-22 DIAGNOSIS — Z79899 Other long term (current) drug therapy: Secondary | ICD-10-CM | POA: Insufficient documentation

## 2012-11-22 DIAGNOSIS — K219 Gastro-esophageal reflux disease without esophagitis: Secondary | ICD-10-CM | POA: Insufficient documentation

## 2012-11-22 DIAGNOSIS — Z8711 Personal history of peptic ulcer disease: Secondary | ICD-10-CM | POA: Insufficient documentation

## 2012-11-22 DIAGNOSIS — Z87448 Personal history of other diseases of urinary system: Secondary | ICD-10-CM | POA: Insufficient documentation

## 2012-11-22 DIAGNOSIS — Z87891 Personal history of nicotine dependence: Secondary | ICD-10-CM | POA: Insufficient documentation

## 2012-11-22 DIAGNOSIS — I2510 Atherosclerotic heart disease of native coronary artery without angina pectoris: Secondary | ICD-10-CM | POA: Insufficient documentation

## 2012-11-22 DIAGNOSIS — Z4802 Encounter for removal of sutures: Secondary | ICD-10-CM | POA: Insufficient documentation

## 2012-11-22 DIAGNOSIS — M109 Gout, unspecified: Secondary | ICD-10-CM | POA: Insufficient documentation

## 2012-11-22 DIAGNOSIS — Z9861 Coronary angioplasty status: Secondary | ICD-10-CM | POA: Insufficient documentation

## 2012-11-22 DIAGNOSIS — Z8739 Personal history of other diseases of the musculoskeletal system and connective tissue: Secondary | ICD-10-CM | POA: Insufficient documentation

## 2012-11-22 NOTE — ED Notes (Signed)
For suture removal-sutures to forehead

## 2012-11-22 NOTE — ED Provider Notes (Signed)
Medical screening examination/treatment/procedure(s) were performed by non-physician practitioner and as supervising physician I was immediately available for consultation/collaboration.   Gilda Crease, MD 11/22/12 [phone]

## 2012-11-22 NOTE — ED Provider Notes (Signed)
History     CSN: [id number]  Arrival date & time 11/22/12  1526   First MD Initiated Contact with Patient 11/22/12 1535      Chief Complaint  Patient presents with  . Suture / Staple Removal    (Consider location/radiation/quality/duration/timing/severity/associated sxs/prior treatment) Patient is a 79 y.o. male presenting with suture removal. The history is provided by the patient.  Suture / Staple Removal  The sutures were placed 7 to 10 days ago. Treatments since wound repair include antibiotic ointment use. There has been no drainage from the wound. There is no redness present. There is no swelling present. The pain has no pain.    Past Medical History  Diagnosis Date  . CAD (coronary artery disease)   . HTN (hypertension)   . Peptic ulcer disease   . BPH (benign prostatic hyperplasia)   . Insomnia   . GERD (gastroesophageal reflux disease)   . CHF (congestive heart failure)   . Hyperlipidemia   . Hypothyroidism   . H/O hiatal hernia   . Osteoarthritis     GOUT  . DDD (degenerative disc disease), lumbar   . Exertional shortness of breath     "@ times" (09/25/2012)  . History of blood transfusion     "related to bleeding ulcers" (09/25/2012)  . Lower GI bleeding ?2009    "once; had it cauterized" (09/25/2012)  . Stroke ?2003    denies residual (09/25/2012)  . Chronic lower back pain   . Gout     "once in awhile" (09/25/2012)  . Chronic kidney disease (CKD), stage IV (severe)     Past Surgical History  Procedure Laterality Date  . Vagotomy and pyloroplasty  1970's?    "bleeding ulcers" (09/25/2012)  . S/p decompressive laminectomy,medial facetectomy and forminotomy l4-l5    . Great toe chilectomy left  ?1970's  . Hemoclipped gastric ulcer  2009  . Lumbar epidural steroid injection l2-3  2010  . Coronary angioplasty with stent placement  09/20/1997; ~ 96295    "2 + 2; total of 4" (09/25/2012)DR H SMITH   . Cataract extraction w/ intraocular lens implant  ~ 2009    RIGHT  EYE  . Hernia repair  09/25/2012    VHR w/mesh (09/25/2012)  . Toe surgery      "cut one of my toes off and had it reattached; ? toe" (09/25/2012)  . Brow lift  2000's    "both" (09/25/2012)  . Dupuytren contracture release       right palm/notes (09/25/2012)  . Ventral hernia repair  09/25/2012    Procedure: LAPAROSCOPIC VENTRAL HERNIA;  Surgeon: Clovis Pu. Cornett, MD;  Location: [facility] OR;  Service: General;  Laterality: N/A;  laparoscopic incisional hernia repair with mesh  . Insertion of mesh  09/25/2012    Procedure: INSERTION OF MESH;  Surgeon: Clovis Pu. Cornett, MD;  Location: [facility] OR;  Service: General;  Laterality: N/A;    No family history on file.  History  Substance Use Topics  . Smoking status: Former Smoker -- 0.12 packs/day for 5 years    Types: Cigarettes    Quit date: 07/20/1954  . Smokeless tobacco: Never Used  . Alcohol Use: Yes     Comment: 09/25/2012 "last alcohol was in 1949; have had a drink or 2"      Review of Systems  All other systems reviewed and are negative.    Allergies  Penicillins; Allopurinol; and Benicar  Home Medications   Current Outpatient Rx  Name  Route  Sig  Dispense  Refill  . aspirin EC 81 MG tablet   Oral   Take 81 mg by mouth daily.         Marland Kitchen atenolol (TENORMIN) 50 MG tablet   Oral   Take 25 mg by mouth daily.         . cholecalciferol (VITAMIN D) 1000 UNITS tablet   Oral   Take 1,000 Units by mouth daily with breakfast.          . ciprofloxacin (CIPRO) 500 MG tablet   Oral   Take 0.5 tablets (250 mg total) by mouth 2 (two) times daily.   14 tablet   0   . clopidogrel (PLAVIX) 75 MG tablet   Oral   Take 1 tablet (75 mg total) by mouth daily.   30 tablet   0     Restart on friday   . Coenzyme Q10 (COQ-10) 100 MG CAPS   Oral   Take 1 capsule by mouth daily with breakfast.          . colchicine 0.6 MG tablet   Oral   Take 0.6 mg by mouth 2 (two) times daily as needed. For gout flare         . furosemide (LASIX)  40 MG tablet   Oral   Take 20-40 mg by mouth daily.          Marland Kitchen HYDROcodone-acetaminophen (NORCO) 10-325 MG per tablet   Oral   Take 1 tablet by mouth every 6 (six) hours as needed.   30 tablet   0   . levothyroxine (SYNTHROID, LEVOTHROID) 75 MCG tablet   Oral   Take 75 mcg by mouth daily.          . nitroGLYCERIN (NITROSTAT) 0.4 MG SL tablet   Sublingual   Place 0.4 mg under the tongue every 5 (five) minutes as needed. For chest pain         . pantoprazole (PROTONIX) 20 MG tablet   Oral   Take 20 mg by mouth daily.         . polyethylene glycol (MIRALAX / GLYCOLAX) packet   Oral   Take 17 g by mouth daily.   14 each   0   . simvastatin (ZOCOR) 20 MG tablet   Oral   Take 1 tablet (20 mg total) by mouth daily at 6 PM.   30 tablet   11   . Tamsulosin HCl (FLOMAX) 0.4 MG CAPS   Oral   Take 0.4 mg by mouth at bedtime.          . traMADol (ULTRAM) 50 MG tablet   Oral   Take 1 tablet (50 mg total) by mouth every 6 (six) hours as needed for pain.   15 tablet   0     BP 117/51  Pulse 63  Temp(Src) 97.6 F (36.4 C) (Oral)  Resp 16  SpO2 98%  Physical Exam  Nursing note and vitals reviewed. Constitutional: He is oriented to person, place, and time. He appears well-developed and well-nourished.  HENT:  Head: Normocephalic.  Healed laceration right forehead,    Eyes: Conjunctivae are normal. Pupils are equal, round, and reactive to light.  Musculoskeletal: Normal range of motion.  Neurological: He is alert and oriented to person, place, and time.  Skin: Skin is warm.    ED Course  Procedures (including critical care time)  Labs Reviewed - No data to display No results found.  1. Visit for suture removal       MDM  Sutures removed, counseled on wound care        Elson Areas, PA-C 11/22/12 [address] [place], [place] 11/22/12 1626

## 2012-11-22 NOTE — ED Notes (Signed)
PA at bedside.

## 2012-11-28 IMAGING — RF DG ESOPHAGUS
15 of 18 series · 19 of 24 positions shown · non-contrast
Comparison: None.

CLINICAL DATA: Difficulty swallowing

ESOPHOGRAM/BARIUM SWALLOW
TECHNIQUE: Single contrast examination was performed using thin
barium.
Fluoroscopy time:  2.4 minutes.

[Series 2: run · 3 of 7 slices shown (1 of 15)]
[im 1/7]
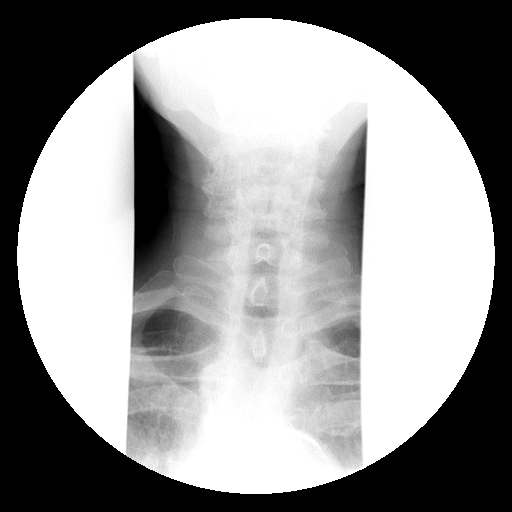
[im 3/7]
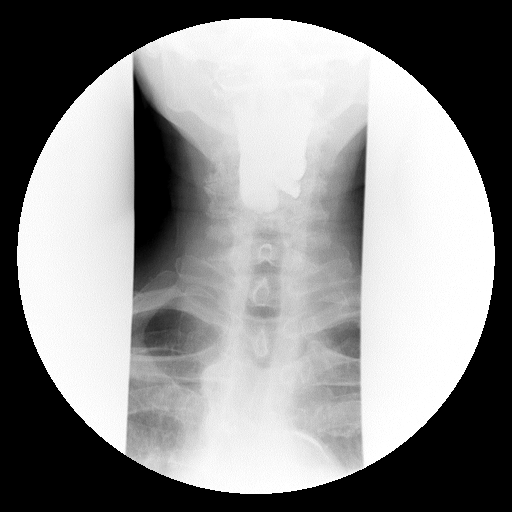
[im 7/7]
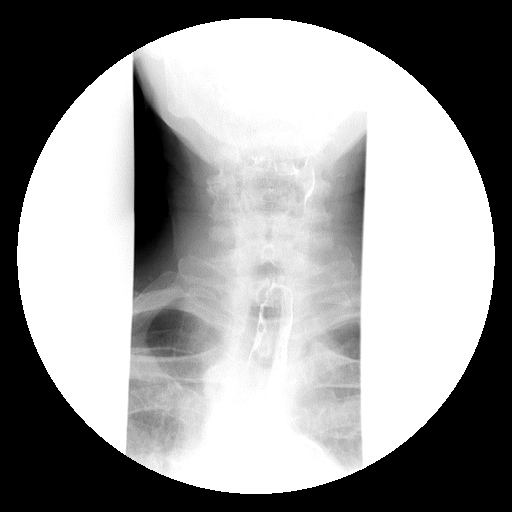

[Series 3: run · 3 of 7 slices shown (2 of 15)]
[im 1/7]
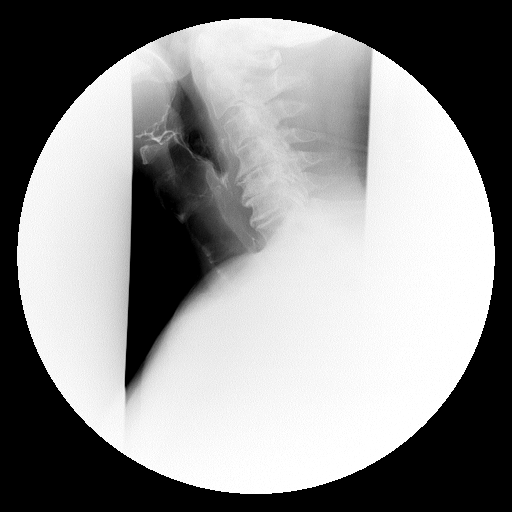
[im 3/7]
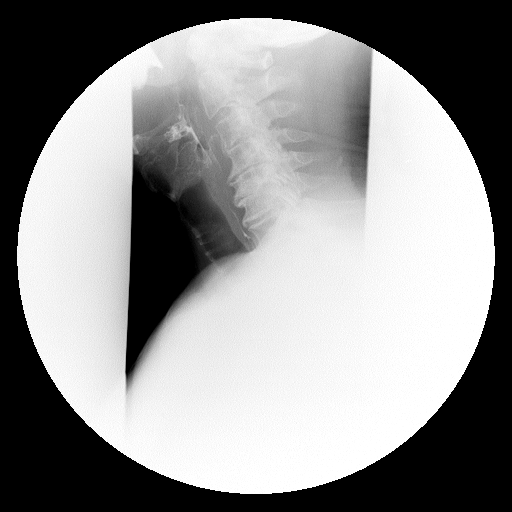
[im 5/7]
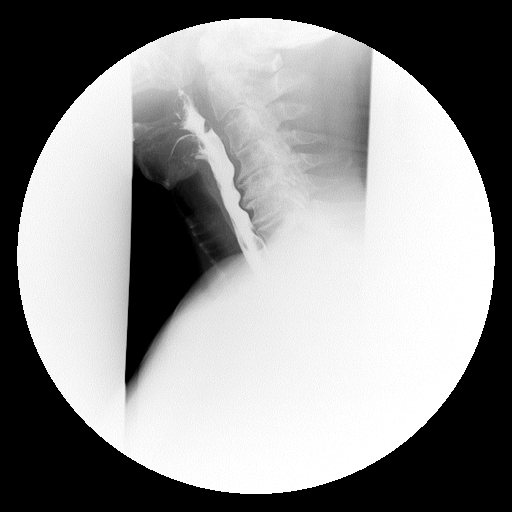

[Series 5: run · 1 of 1 slices shown (3 of 15)]
[im 1/1]
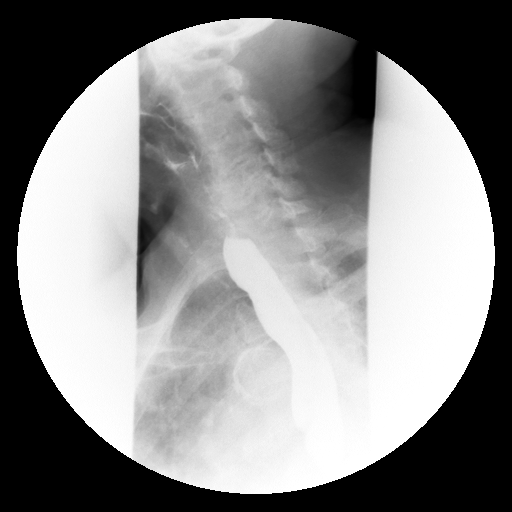

[Series 6: run · 1 of 1 slices shown (4 of 15)]
[im 1/1]
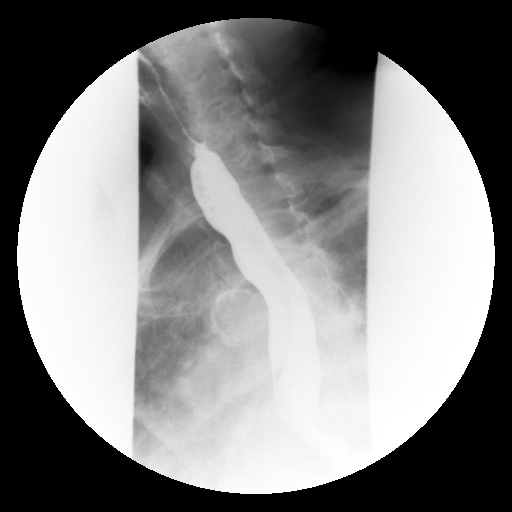

[Series 7: run · 1 of 1 slices shown (5 of 15)]
[im 1/1]
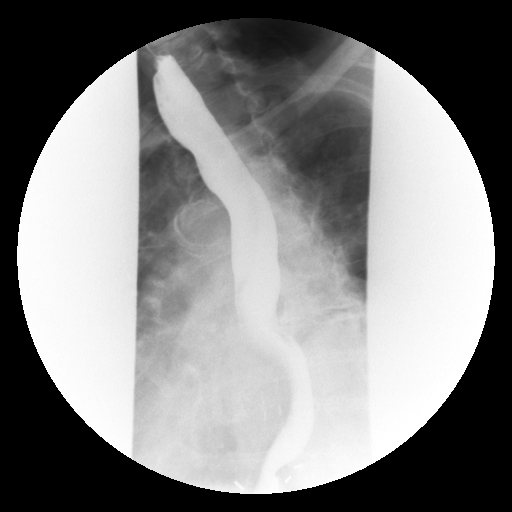

[Series 9: run · 1 of 1 slices shown (6 of 15)]
[im 1/1]
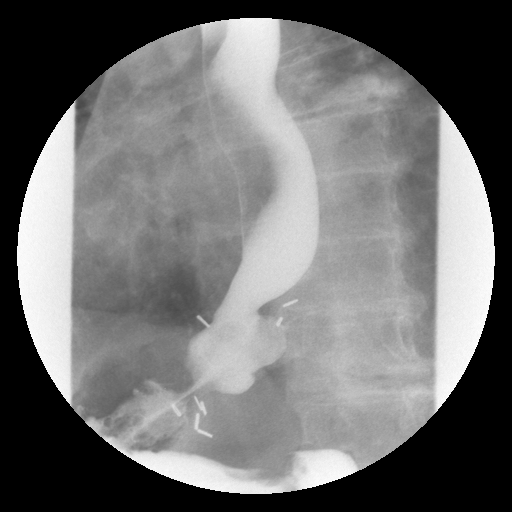

[Series 10: run · 1 of 1 slices shown (7 of 15)]
[im 1/1]
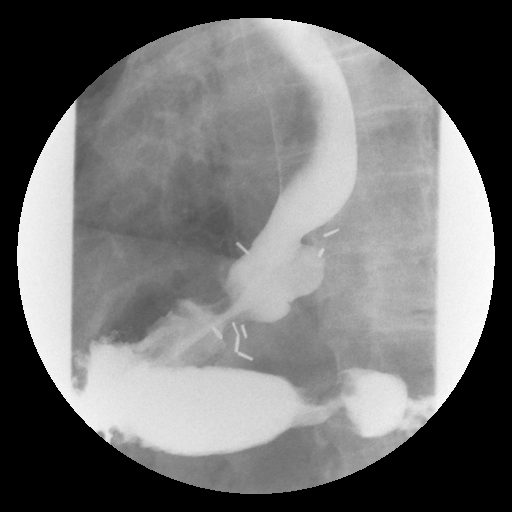

[Series 11: run · 1 of 1 slices shown (8 of 15)]
[im 1/1]
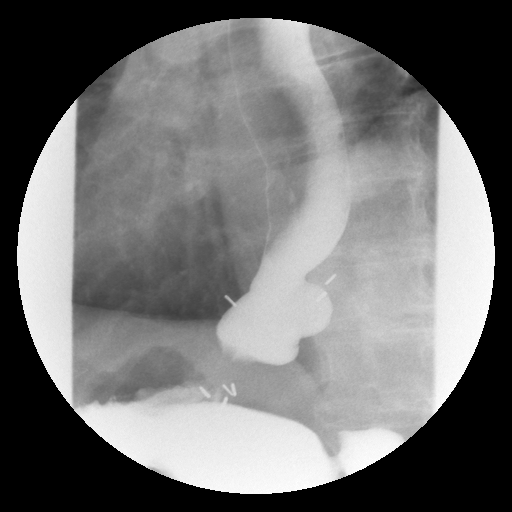

[Series 12: run · 1 of 1 slices shown (9 of 15)]
[im 1/1]
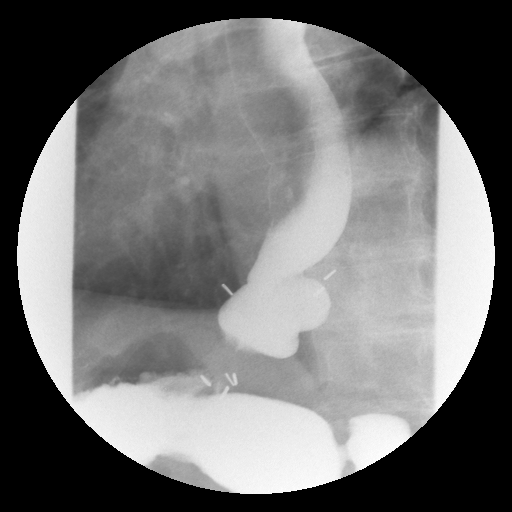

[Series 14: run · 1 of 1 slices shown (10 of 15)]
[im 1/1]
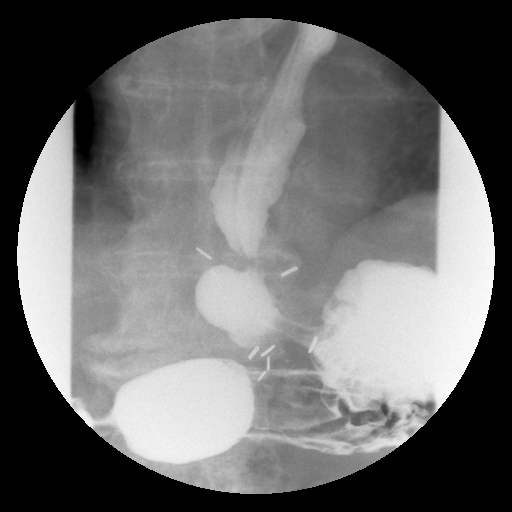

[Series 15: run · 1 of 1 slices shown (11 of 15)]
[im 1/1]
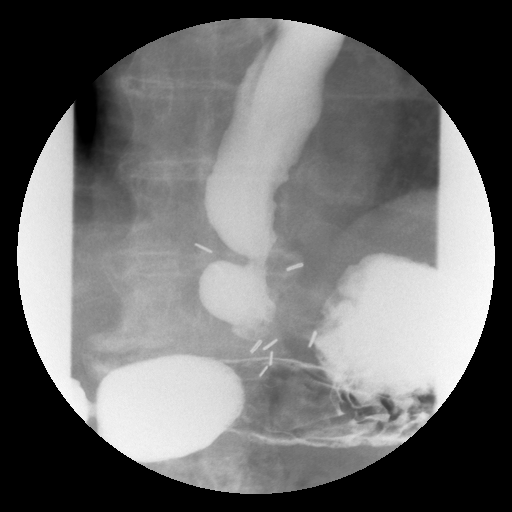

[Series 16: run · 1 of 1 slices shown (12 of 15)]
[im 1/1]
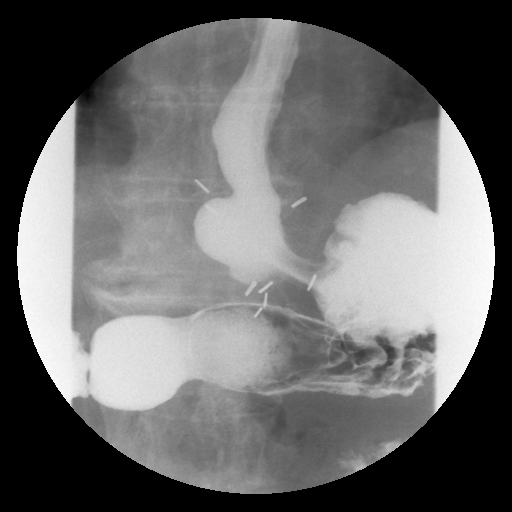

[Series 17: run · 1 of 1 slices shown (13 of 15)]
[im 1/1]
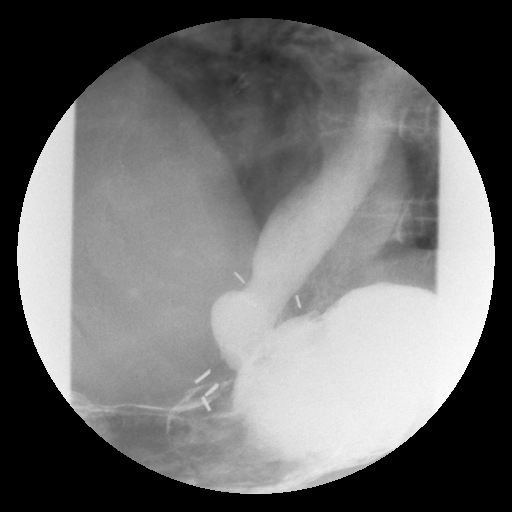

[Series 19: run · 1 of 1 slices shown (14 of 15)]
[im 1/1]
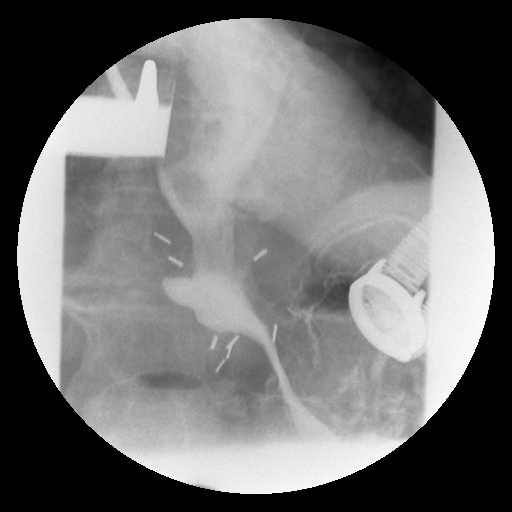

[Series 20: run · 1 of 1 slices shown (15 of 15)]
[im 1/1]
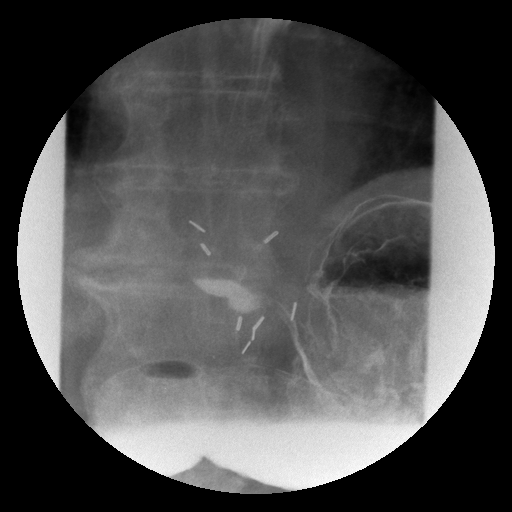

[19 of 24 positions shown; findings below may reference images not displayed]

FINDINGS: Initially rapid sequence spot films of the cervical
esophagus were performed.  The swallowing mechanism appears normal
and no aspiration is seen.  There is minimal penetration noted.
There are mild tertiary contractions in the distal esophagus.
There does appear to be a small hiatal hernia present with multiple
surgical clips around the gastroesophageal junction.  There is
moderate gastroesophageal reflux noted.  A barium pill was given at
the end of the study which delayed at the level of the small hiatal
hernia but did pass into the stomach intact.
IMPRESSION: 1.  Small hiatal hernia with moderate reflux.
2.  Barium pill passes into the stomach after minimal delay.
3.  Mild tertiary contractions in the distal esophagus.

## 2012-12-18 ENCOUNTER — Emergency Department (HOSPITAL_BASED_OUTPATIENT_CLINIC_OR_DEPARTMENT_OTHER): Payer: Medicare Other

## 2012-12-18 ENCOUNTER — Encounter (HOSPITAL_BASED_OUTPATIENT_CLINIC_OR_DEPARTMENT_OTHER): Payer: Self-pay | Admitting: Family Medicine

## 2012-12-18 ENCOUNTER — Emergency Department (HOSPITAL_BASED_OUTPATIENT_CLINIC_OR_DEPARTMENT_OTHER)
Admission: EM | Admit: 2012-12-18 | Discharge: 2012-12-18 | Disposition: A | Discharge status: Home / Self Care | Payer: Medicare Other | Attending: Emergency Medicine | Admitting: Emergency Medicine

## 2012-12-18 DIAGNOSIS — Z7982 Long term (current) use of aspirin: Secondary | ICD-10-CM | POA: Insufficient documentation

## 2012-12-18 DIAGNOSIS — Z8711 Personal history of peptic ulcer disease: Secondary | ICD-10-CM | POA: Insufficient documentation

## 2012-12-18 DIAGNOSIS — N184 Chronic kidney disease, stage 4 (severe): Secondary | ICD-10-CM | POA: Insufficient documentation

## 2012-12-18 DIAGNOSIS — K219 Gastro-esophageal reflux disease without esophagitis: Secondary | ICD-10-CM | POA: Insufficient documentation

## 2012-12-18 DIAGNOSIS — Z9889 Other specified postprocedural states: Secondary | ICD-10-CM | POA: Insufficient documentation

## 2012-12-18 DIAGNOSIS — J40 Bronchitis, not specified as acute or chronic: Secondary | ICD-10-CM | POA: Insufficient documentation

## 2012-12-18 DIAGNOSIS — I2510 Atherosclerotic heart disease of native coronary artery without angina pectoris: Secondary | ICD-10-CM | POA: Insufficient documentation

## 2012-12-18 DIAGNOSIS — E785 Hyperlipidemia, unspecified: Secondary | ICD-10-CM | POA: Insufficient documentation

## 2012-12-18 DIAGNOSIS — R1084 Generalized abdominal pain: Secondary | ICD-10-CM | POA: Insufficient documentation

## 2012-12-18 DIAGNOSIS — M545 Low back pain, unspecified: Secondary | ICD-10-CM | POA: Insufficient documentation

## 2012-12-18 DIAGNOSIS — Z8673 Personal history of transient ischemic attack (TIA), and cerebral infarction without residual deficits: Secondary | ICD-10-CM | POA: Insufficient documentation

## 2012-12-18 DIAGNOSIS — Z79899 Other long term (current) drug therapy: Secondary | ICD-10-CM | POA: Insufficient documentation

## 2012-12-18 DIAGNOSIS — Z8739 Personal history of other diseases of the musculoskeletal system and connective tissue: Secondary | ICD-10-CM | POA: Insufficient documentation

## 2012-12-18 DIAGNOSIS — M109 Gout, unspecified: Secondary | ICD-10-CM | POA: Insufficient documentation

## 2012-12-18 DIAGNOSIS — N400 Enlarged prostate without lower urinary tract symptoms: Secondary | ICD-10-CM | POA: Insufficient documentation

## 2012-12-18 DIAGNOSIS — I129 Hypertensive chronic kidney disease with stage 1 through stage 4 chronic kidney disease, or unspecified chronic kidney disease: Secondary | ICD-10-CM | POA: Insufficient documentation

## 2012-12-18 DIAGNOSIS — Z8719 Personal history of other diseases of the digestive system: Secondary | ICD-10-CM | POA: Insufficient documentation

## 2012-12-18 DIAGNOSIS — E039 Hypothyroidism, unspecified: Secondary | ICD-10-CM | POA: Insufficient documentation

## 2012-12-18 DIAGNOSIS — M1990 Unspecified osteoarthritis, unspecified site: Secondary | ICD-10-CM | POA: Insufficient documentation

## 2012-12-18 DIAGNOSIS — I509 Heart failure, unspecified: Secondary | ICD-10-CM | POA: Insufficient documentation

## 2012-12-18 DIAGNOSIS — Z87891 Personal history of nicotine dependence: Secondary | ICD-10-CM | POA: Insufficient documentation

## 2012-12-18 DIAGNOSIS — Z5189 Encounter for other specified aftercare: Secondary | ICD-10-CM | POA: Insufficient documentation

## 2012-12-18 DIAGNOSIS — G8929 Other chronic pain: Secondary | ICD-10-CM | POA: Insufficient documentation

## 2012-12-18 DIAGNOSIS — Z8669 Personal history of other diseases of the nervous system and sense organs: Secondary | ICD-10-CM | POA: Insufficient documentation

## 2012-12-18 MED ORDER — AEROCHAMBER PLUS FLO-VU MEDIUM MISC
1.0000 | Freq: Once | Status: AC
  Administered 2012-12-18: 1
  Filled 2012-12-18: qty 1

## 2012-12-18 MED ORDER — ACETAMINOPHEN 325 MG PO TABS
650.0000 mg | ORAL_TABLET | Freq: Once | ORAL | Status: AC
  Administered 2012-12-18: 650 mg via ORAL
  Filled 2012-12-18: qty 2

## 2012-12-18 MED ORDER — ALBUTEROL SULFATE HFA 108 (90 BASE) MCG/ACT IN AERS
2.0000 | INHALATION_SPRAY | RESPIRATORY_TRACT | Status: DC | PRN
  Administered 2012-12-18: 2 via RESPIRATORY_TRACT
  Filled 2012-12-18: qty 6.7

## 2012-12-18 NOTE — ED Provider Notes (Addendum)
History     CSN: [id number]  Arrival date & time 12/18/12  1338   First MD Initiated Contact with Patient 12/18/12 1349      Chief Complaint  Patient presents with  . Cough  . Abdominal Pain    (Consider location/radiation/quality/duration/timing/severity/associated sxs/prior treatment) HPI Complains of cough for one month and also complains of abdominal pain, diffuse for one month which is exacerbated by coughing.  He denies fever denies vomiting denies anorexia last bowel movement today, slightly loose no blood per rectum. No treatment prior to coming here no prior evaluations for cough. Abdominal pain is improved by lying still. Denies shortness of breath. Denies other associated symptoms Past Medical History  Diagnosis Date  . CAD (coronary artery disease)   . HTN (hypertension)   . Peptic ulcer disease   . BPH (benign prostatic hyperplasia)   . Insomnia   . GERD (gastroesophageal reflux disease)   . CHF (congestive heart failure)   . Hyperlipidemia   . Hypothyroidism   . H/O hiatal hernia   . Osteoarthritis     GOUT  . DDD (degenerative disc disease), lumbar   . Exertional shortness of breath     "@ times" (09/25/2012)  . History of blood transfusion     "related to bleeding ulcers" (09/25/2012)  . Lower GI bleeding ?2009    "once; had it cauterized" (09/25/2012)  . Stroke ?2003    denies residual (09/25/2012)  . Chronic lower back pain   . Gout     "once in awhile" (09/25/2012)  . Chronic kidney disease (CKD), stage IV (severe)     Past Surgical History  Procedure Laterality Date  . Vagotomy and pyloroplasty  1970's?    "bleeding ulcers" (09/25/2012)  . S/p decompressive laminectomy,medial facetectomy and forminotomy l4-l5    . Great toe chilectomy left  ?1970's  . Hemoclipped gastric ulcer  2009  . Lumbar epidural steroid injection l2-3  2010  . Coronary angioplasty with stent placement  09/20/1997; ~ 40981    "2 + 2; total of 4" (09/25/2012)DR H SMITH   . Cataract  extraction w/ intraocular lens implant  ~ 2009    RIGHT EYE  . Hernia repair  09/25/2012    VHR w/mesh (09/25/2012)  . Toe surgery      "cut one of my toes off and had it reattached; ? toe" (09/25/2012)  . Brow lift  2000's    "both" (09/25/2012)  . Dupuytren contracture release       right palm/notes (09/25/2012)  . Ventral hernia repair  09/25/2012    Procedure: LAPAROSCOPIC VENTRAL HERNIA;  Surgeon: Clovis Pu. Cornett, MD;  Location: [facility] OR;  Service: General;  Laterality: N/A;  laparoscopic incisional hernia repair with mesh  . Insertion of mesh  09/25/2012    Procedure: INSERTION OF MESH;  Surgeon: Clovis Pu. Cornett, MD;  Location: [facility] OR;  Service: General;  Laterality: N/A;    No family history on file.  History  Substance Use Topics  . Smoking status: Former Smoker -- 0.12 packs/day for 5 years    Types: Cigarettes    Quit date: 07/20/1954  . Smokeless tobacco: Never Used  . Alcohol Use: Yes     Comment: 09/25/2012 "last alcohol was in 1949; have had a drink or 2"      Review of Systems  Constitutional: Negative.   HENT: Negative.   Respiratory: Positive for cough.   Cardiovascular: Negative.   Gastrointestinal: Positive for abdominal pain.  Musculoskeletal: Negative.  Skin: Negative.   Neurological: Negative.   Psychiatric/Behavioral: Negative.   All other systems reviewed and are negative.    Allergies  Penicillins; Allopurinol; and Benicar  Home Medications   Current Outpatient Rx  Name  Route  Sig  Dispense  Refill  . aspirin EC 81 MG tablet   Oral   Take 81 mg by mouth daily.         Marland Kitchen atenolol (TENORMIN) 50 MG tablet   Oral   Take 25 mg by mouth daily.         . cholecalciferol (VITAMIN D) 1000 UNITS tablet   Oral   Take 1,000 Units by mouth daily with breakfast.          . ciprofloxacin (CIPRO) 500 MG tablet   Oral   Take 0.5 tablets (250 mg total) by mouth 2 (two) times daily.   14 tablet   0   . clopidogrel (PLAVIX) 75 MG tablet   Oral    Take 1 tablet (75 mg total) by mouth daily.   30 tablet   0     Restart on friday   . Coenzyme Q10 (COQ-10) 100 MG CAPS   Oral   Take 1 capsule by mouth daily with breakfast.          . colchicine 0.6 MG tablet   Oral   Take 0.6 mg by mouth 2 (two) times daily as needed. For gout flare         . furosemide (LASIX) 40 MG tablet   Oral   Take 20-40 mg by mouth daily.          Marland Kitchen HYDROcodone-acetaminophen (NORCO) 10-325 MG per tablet   Oral   Take 1 tablet by mouth every 6 (six) hours as needed.   30 tablet   0   . levothyroxine (SYNTHROID, LEVOTHROID) 75 MCG tablet   Oral   Take 75 mcg by mouth daily.          . nitroGLYCERIN (NITROSTAT) 0.4 MG SL tablet   Sublingual   Place 0.4 mg under the tongue every 5 (five) minutes as needed. For chest pain         . pantoprazole (PROTONIX) 20 MG tablet   Oral   Take 20 mg by mouth daily.         . polyethylene glycol (MIRALAX / GLYCOLAX) packet   Oral   Take 17 g by mouth daily.   14 each   0   . simvastatin (ZOCOR) 20 MG tablet   Oral   Take 1 tablet (20 mg total) by mouth daily at 6 PM.   30 tablet   11   . Tamsulosin HCl (FLOMAX) 0.4 MG CAPS   Oral   Take 0.4 mg by mouth at bedtime.          . traMADol (ULTRAM) 50 MG tablet   Oral   Take 1 tablet (50 mg total) by mouth every 6 (six) hours as needed for pain.   15 tablet   0     BP 111/44  Pulse 60  Temp(Src) 97.6 F (36.4 C) (Oral)  Resp 18  Ht 5\' 7"  (1.702 m)  Wt 145 lb (65.772 kg)  BMI 22.71 kg/m2  SpO2 98%  Physical Exam  Nursing note and vitals reviewed. Constitutional: He appears well-developed and well-nourished. He appears distressed.  HENT:  Head: Normocephalic and atraumatic.  Eyes: Conjunctivae are normal. Pupils are equal, round, and reactive to light.  Neck:  Neck supple. No tracheal deviation present. No thyromegaly present.  Cardiovascular: Normal rate and regular rhythm.   No murmur heard. Pulmonary/Chest: Effort normal  and breath sounds normal.  Speaks in paragraphs no respiratory distress. Diffuse rhonchi. Occasional cough  Abdominal: Soft. Bowel sounds are normal. He exhibits no distension and no mass. There is no tenderness. There is no rebound and no guarding.  Genitourinary: Penis normal.  Normal male genitalia  Musculoskeletal: Normal range of motion. He exhibits no edema and no tenderness.  Neurological: He is alert. Coordination normal.  Skin: Skin is warm and dry. No rash noted.  Psychiatric: He has a normal mood and affect.    ED Course  Procedures (including critical care time)  Labs Reviewed - No data to display No results found. Abdominal pain improved after treatment with Tylenol  No diagnosis found. Results for orders placed during the hospital encounter of 11/04/12  URINE CULTURE      Result Value Range   Specimen Description URINE, CLEAN CATCH     Special Requests NONE     Culture  Setup Time 11/05/2012 06:08     Colony Count >=100,000 COLONIES/ML     Culture ENTEROCOCCUS SPECIES     Report Status 11/06/2012 FINAL     Organism ID, Bacteria ENTEROCOCCUS SPECIES    URINALYSIS, ROUTINE W REFLEX MICROSCOPIC      Result Value Range   Color, Urine YELLOW  YELLOW   APPearance CLOUDY (*) CLEAR   Specific Gravity, Urine 1.008  1.005 - 1.030   pH 6.0  5.0 - 8.0   Glucose, UA NEGATIVE  NEGATIVE mg/dL   Hgb urine dipstick SMALL (*) NEGATIVE   Bilirubin Urine NEGATIVE  NEGATIVE   Ketones, ur NEGATIVE  NEGATIVE mg/dL   Protein, ur 30 (*) NEGATIVE mg/dL   Urobilinogen, UA 0.2  0.0 - 1.0 mg/dL   Nitrite NEGATIVE  NEGATIVE   Leukocytes, UA LARGE (*) NEGATIVE  URINE MICROSCOPIC-ADD ON      Result Value Range   Squamous Epithelial / LPF RARE  RARE   WBC, UA TOO NUMEROUS TO COUNT  <3 WBC/hpf   RBC / HPF 3-6  <3 RBC/hpf   Bacteria, UA MANY (*) RARE  CBC WITH DIFFERENTIAL      Result Value Range   WBC 6.4  4.0 - 10.5 K/uL   RBC 3.35 (*) 4.22 - 5.81 MIL/uL   Hemoglobin 10.0 (*) 13.0 -  17.0 g/dL   HCT 16.1 (*) 09.6 - 04.5 %   MCV 93.1  78.0 - 100.0 fL   MCH 29.9  26.0 - 34.0 pg   MCHC 32.1  30.0 - 36.0 g/dL   RDW 40.9  81.1 - 91.4 %   Platelets 183  150 - 400 K/uL   Neutrophils Relative 69  43 - 77 %   Neutro Abs 4.5  1.7 - 7.7 K/uL   Lymphocytes Relative 18  12 - 46 %   Lymphs Abs 1.1  0.7 - 4.0 K/uL   Monocytes Relative 10  3 - 12 %   Monocytes Absolute 0.7  0.1 - 1.0 K/uL   Eosinophils Relative 2  0 - 5 %   Eosinophils Absolute 0.1  0.0 - 0.7 K/uL   Basophils Relative 0  0 - 1 %   Basophils Absolute 0.0  0.0 - 0.1 K/uL  BASIC METABOLIC PANEL      Result Value Range   Sodium 138  135 - 145 mEq/L   Potassium 4.1  3.5 -  5.1 mEq/L   Chloride 98  96 - 112 mEq/L   CO2 30  19 - 32 mEq/L   Glucose, Bld 105 (*) 70 - 99 mg/dL   BUN 52 (*) 6 - 23 mg/dL   Creatinine, Ser 2.95 (*) 0.50 - 1.35 mg/dL   Calcium 9.5  8.4 - 62.1 mg/dL   GFR calc non Af Amer 20 (*) >90 mL/min   GFR calc Af Amer 23 (*) >90 mL/min   Dg Chest 2 View  12/18/2012  *RADIOLOGY REPORT*  Clinical Data: Cough, abdominal pain  CHEST - 2 VIEW  Comparison: 03/27/2012; 02/18/2012; chest CT - 04/23/2009  Findings:  Grossly unchanged cardiac silhouette and mediastinal contours with atherosclerotic calcifications within a mildly ectatic thoracic aorta.  There is grossly unchanged mild diffuse thickening of the pulmonary interstitium.  Mild elevation of the right hemidiaphragm. No focal airspace opacities.  No definite pleural effusion or pneumothorax.  No definite evidence of edema.  Grossly unchanged bones.  Surgical clips overlie the gastroesophageal junction.  IMPRESSION: Chronic bronchitic change without acute cardiopulmonary disease.   Original Report Authenticated By: Tacey Ruiz, MD      Chest x-ray viewed by me MDM  Abdominal pain is felt secondary to abdominal wall pain from forcible coughing Plan albuterol HFA with space disease 2 puffs every 4 hours when necessary cough   See Dr.Gates if cough  not improved in a week Diagnosis bronchitis        Doug Sou, MD 12/18/12 1535  Doug Sou, MD 12/18/12 1535

## 2012-12-18 NOTE — Discharge Instructions (Signed)
Bronchitis Use your inhaler 2 puffs every 4 hours as needed for cough or shortness of breath. See Dr.Gates is not improving in a week. It is okay to take Tylenol 650 mg every 4 hours as needed for pain. Bronchitis is a problem of the air tubes leading to your lungs. This problem makes it hard for air to get in and out of the lungs. You may cough a lot because your air tubes are narrow. Going without care can cause lasting (chronic) bronchitis. HOME CARE   Drink enough fluids to keep your pee (urine) clear or pale yellow.  Use a cool mist humidifier.  Quit smoking if you smoke. If you keep smoking, the bronchitis might not get better.  Only take medicine as told by your doctor. GET HELP RIGHT AWAY IF:   Coughing keeps you awake.  You start to wheeze.  You become more sick or weak.  You have a hard time breathing or get short of breath.  You cough up blood.  Coughing lasts more than 2 weeks.  You have a fever.  Your baby is older than 3 months with a rectal temperature of 102 F (38.9 C) or higher.  Your baby is 37 months old or younger with a rectal temperature of 100.4 F (38 C) or higher. MAKE SURE YOU:  Understand these instructions.  Will watch your condition.  Will get help right away if you are not doing well or get worse. Document Released: 02/22/2008 Document Revised: 11/28/2011 Document Reviewed: 08/07/2009 [facility] Patient Information 2013 [place], [place].

## 2012-12-18 NOTE — ED Notes (Signed)
RT Note: Patient was instructed on proper MDI use with spacer. Patient demonstrated the technique well with no complications. He understands the medication indications and currently has no questions. Patient is stable and is in no acute respiratory distress. Rt will continue to monitor.

## 2012-12-18 NOTE — ED Notes (Addendum)
Pt c/o cough, congestion x 1 month and "having to splint my stomach when I cough because it hurts" x 2-3 wks. Pt denies fever, n/v/d. Pt sts last bowel movement last night, denies blood. Denies shortness of breath.

## 2012-12-18 NOTE — ED Notes (Signed)
RN Chip Boer informed of Pt's BP

## 2013-01-01 ENCOUNTER — Encounter (INDEPENDENT_AMBULATORY_CARE_PROVIDER_SITE_OTHER): Payer: Self-pay

## 2013-02-25 ENCOUNTER — Encounter (HOSPITAL_BASED_OUTPATIENT_CLINIC_OR_DEPARTMENT_OTHER): Payer: Self-pay

## 2013-02-25 ENCOUNTER — Emergency Department (HOSPITAL_BASED_OUTPATIENT_CLINIC_OR_DEPARTMENT_OTHER)
Admission: EM | Admit: 2013-02-25 | Discharge: 2013-02-25 | Disposition: A | Discharge status: Home / Self Care | Payer: Medicare Other | Attending: Emergency Medicine | Admitting: Emergency Medicine

## 2013-02-25 ENCOUNTER — Emergency Department (HOSPITAL_BASED_OUTPATIENT_CLINIC_OR_DEPARTMENT_OTHER): Payer: Medicare Other

## 2013-02-25 DIAGNOSIS — G8929 Other chronic pain: Secondary | ICD-10-CM | POA: Insufficient documentation

## 2013-02-25 DIAGNOSIS — Z7982 Long term (current) use of aspirin: Secondary | ICD-10-CM | POA: Insufficient documentation

## 2013-02-25 DIAGNOSIS — Y929 Unspecified place or not applicable: Secondary | ICD-10-CM | POA: Insufficient documentation

## 2013-02-25 DIAGNOSIS — I129 Hypertensive chronic kidney disease with stage 1 through stage 4 chronic kidney disease, or unspecified chronic kidney disease: Secondary | ICD-10-CM | POA: Insufficient documentation

## 2013-02-25 DIAGNOSIS — I509 Heart failure, unspecified: Secondary | ICD-10-CM | POA: Insufficient documentation

## 2013-02-25 DIAGNOSIS — K219 Gastro-esophageal reflux disease without esophagitis: Secondary | ICD-10-CM | POA: Insufficient documentation

## 2013-02-25 DIAGNOSIS — S61209A Unspecified open wound of unspecified finger without damage to nail, initial encounter: Secondary | ICD-10-CM | POA: Insufficient documentation

## 2013-02-25 DIAGNOSIS — Z880 Allergy status to penicillin: Secondary | ICD-10-CM | POA: Insufficient documentation

## 2013-02-25 DIAGNOSIS — N400 Enlarged prostate without lower urinary tract symptoms: Secondary | ICD-10-CM | POA: Insufficient documentation

## 2013-02-25 DIAGNOSIS — Z7902 Long term (current) use of antithrombotics/antiplatelets: Secondary | ICD-10-CM | POA: Insufficient documentation

## 2013-02-25 DIAGNOSIS — Z8673 Personal history of transient ischemic attack (TIA), and cerebral infarction without residual deficits: Secondary | ICD-10-CM | POA: Insufficient documentation

## 2013-02-25 DIAGNOSIS — G4700 Insomnia, unspecified: Secondary | ICD-10-CM | POA: Insufficient documentation

## 2013-02-25 DIAGNOSIS — M545 Low back pain, unspecified: Secondary | ICD-10-CM | POA: Insufficient documentation

## 2013-02-25 DIAGNOSIS — I2510 Atherosclerotic heart disease of native coronary artery without angina pectoris: Secondary | ICD-10-CM | POA: Insufficient documentation

## 2013-02-25 DIAGNOSIS — Z8711 Personal history of peptic ulcer disease: Secondary | ICD-10-CM | POA: Insufficient documentation

## 2013-02-25 DIAGNOSIS — W298XXA Contact with other powered powered hand tools and household machinery, initial encounter: Secondary | ICD-10-CM | POA: Insufficient documentation

## 2013-02-25 DIAGNOSIS — Z79899 Other long term (current) drug therapy: Secondary | ICD-10-CM | POA: Insufficient documentation

## 2013-02-25 DIAGNOSIS — Z87891 Personal history of nicotine dependence: Secondary | ICD-10-CM | POA: Insufficient documentation

## 2013-02-25 DIAGNOSIS — M51379 Other intervertebral disc degeneration, lumbosacral region without mention of lumbar back pain or lower extremity pain: Secondary | ICD-10-CM | POA: Insufficient documentation

## 2013-02-25 DIAGNOSIS — Y939 Activity, unspecified: Secondary | ICD-10-CM | POA: Insufficient documentation

## 2013-02-25 DIAGNOSIS — Z8719 Personal history of other diseases of the digestive system: Secondary | ICD-10-CM | POA: Insufficient documentation

## 2013-02-25 DIAGNOSIS — E039 Hypothyroidism, unspecified: Secondary | ICD-10-CM | POA: Insufficient documentation

## 2013-02-25 DIAGNOSIS — E785 Hyperlipidemia, unspecified: Secondary | ICD-10-CM | POA: Insufficient documentation

## 2013-02-25 DIAGNOSIS — M109 Gout, unspecified: Secondary | ICD-10-CM | POA: Insufficient documentation

## 2013-02-25 DIAGNOSIS — N184 Chronic kidney disease, stage 4 (severe): Secondary | ICD-10-CM | POA: Insufficient documentation

## 2013-02-25 DIAGNOSIS — S61219A Laceration without foreign body of unspecified finger without damage to nail, initial encounter: Secondary | ICD-10-CM

## 2013-02-25 DIAGNOSIS — S62639B Displaced fracture of distal phalanx of unspecified finger, initial encounter for open fracture: Secondary | ICD-10-CM | POA: Insufficient documentation

## 2013-02-25 DIAGNOSIS — M5137 Other intervertebral disc degeneration, lumbosacral region: Secondary | ICD-10-CM | POA: Insufficient documentation

## 2013-02-25 DIAGNOSIS — Z9861 Coronary angioplasty status: Secondary | ICD-10-CM | POA: Insufficient documentation

## 2013-02-25 MED ORDER — CEPHALEXIN 500 MG PO CAPS: 500.0000 mg | ORAL_CAPSULE | Freq: Four times a day (QID) | ORAL | Status: DC

## 2013-02-25 MED ORDER — LIDOCAINE-EPINEPHRINE 2 %-1:100000 IJ SOLN
INTRAMUSCULAR | Status: AC
  Filled 2013-02-25: qty 1

## 2013-02-25 MED ORDER — LIDOCAINE HCL 2 % IJ SOLN
INTRAMUSCULAR | Status: AC
  Administered 2013-02-25: 400 mg
  Filled 2013-02-25: qty 20

## 2013-02-25 NOTE — ED Notes (Signed)
Laceration to left second, third and fourth digit of left hand.

## 2013-02-25 NOTE — ED Provider Notes (Addendum)
History     CSN: [id number]  Arrival date & time 02/25/13  1116   First MD Initiated Contact with Patient 02/25/13 1118      Chief Complaint  Patient presents with  . Extremity Laceration    (Consider location/radiation/quality/duration/timing/severity/associated sxs/prior treatment) Patient is a 79 y.o. male presenting with hand injury. The history is provided by the patient.  Hand Injury Location:  Finger Time since incident:  1 hour Injury: yes   Mechanism of injury comment:  Fingers got caught in the table saw Finger location:  L middle finger, L ring finger and L index finger Pain details:    Quality:  Aching and sharp   Radiates to:  Does not radiate   Severity:  Mild   Onset quality:  Sudden   Timing:  Constant   Progression:  Unchanged Chronicity:  New Handedness:  Right-handed Foreign body present:  Unable to specify (was cutting wood with the table saw) Tetanus status:  Up to date Prior injury to area:  No Worsened by:  Nothing tried Ineffective treatments:  None tried Associated symptoms: no muscle weakness, no numbness, no swelling and no tingling     Past Medical History  Diagnosis Date  . CAD (coronary artery disease)   . HTN (hypertension)   . Peptic ulcer disease   . BPH (benign prostatic hyperplasia)   . Insomnia   . GERD (gastroesophageal reflux disease)   . CHF (congestive heart failure)   . Hyperlipidemia   . Hypothyroidism   . H/O hiatal hernia   . Osteoarthritis     GOUT  . DDD (degenerative disc disease), lumbar   . Exertional shortness of breath     "@ times" (09/25/2012)  . History of blood transfusion     "related to bleeding ulcers" (09/25/2012)  . Lower GI bleeding ?2009    "once; had it cauterized" (09/25/2012)  . Stroke ?2003    denies residual (09/25/2012)  . Chronic lower back pain   . Gout     "once in awhile" (09/25/2012)  . Chronic kidney disease (CKD), stage IV (severe)     Past Surgical History  Procedure Laterality Date    . Vagotomy and pyloroplasty  1970's?    "bleeding ulcers" (09/25/2012)  . S/p decompressive laminectomy,medial facetectomy and forminotomy l4-l5    . Great toe chilectomy left  ?1970's  . Hemoclipped gastric ulcer  2009  . Lumbar epidural steroid injection l2-3  2010  . Coronary angioplasty with stent placement  09/20/1997; ~ 40981    "2 + 2; total of 4" (09/25/2012)DR H SMITH   . Cataract extraction w/ intraocular lens implant  ~ 2009    RIGHT EYE  . Hernia repair  09/25/2012    VHR w/mesh (09/25/2012)  . Toe surgery      "cut one of my toes off and had it reattached; ? toe" (09/25/2012)  . Brow lift  2000's    "both" (09/25/2012)  . Dupuytren contracture release       right palm/notes (09/25/2012)  . Ventral hernia repair  09/25/2012    Procedure: LAPAROSCOPIC VENTRAL HERNIA;  Surgeon: Clovis Pu. Cornett, MD;  Location: [facility] OR;  Service: General;  Laterality: N/A;  laparoscopic incisional hernia repair with mesh  . Insertion of mesh  09/25/2012    Procedure: INSERTION OF MESH;  Surgeon: Clovis Pu. Cornett, MD;  Location: [facility] OR;  Service: General;  Laterality: N/A;    No family history on file.  History  Substance Use  Topics  . Smoking status: Former Smoker -- 0.12 packs/day for 5 years    Types: Cigarettes    Quit date: 07/20/1954  . Smokeless tobacco: Never Used  . Alcohol Use: Yes     Comment: 09/25/2012 "last alcohol was in 1949; have had a drink or 2"      Review of Systems  All other systems reviewed and are negative.    Allergies  Penicillins; Allopurinol; and Benicar  Home Medications   Current Outpatient Rx  Name  Route  Sig  Dispense  Refill  . aspirin EC 81 MG tablet   Oral   Take 81 mg by mouth daily.         Marland Kitchen atenolol (TENORMIN) 50 MG tablet   Oral   Take 25 mg by mouth daily.         . cholecalciferol (VITAMIN D) 1000 UNITS tablet   Oral   Take 1,000 Units by mouth daily with breakfast.          . ciprofloxacin (CIPRO) 500 MG tablet   Oral   Take 0.5  tablets (250 mg total) by mouth 2 (two) times daily.   14 tablet   0   . clopidogrel (PLAVIX) 75 MG tablet   Oral   Take 1 tablet (75 mg total) by mouth daily.   30 tablet   0     Restart on friday   . Coenzyme Q10 (COQ-10) 100 MG CAPS   Oral   Take 1 capsule by mouth daily with breakfast.          . colchicine 0.6 MG tablet   Oral   Take 0.6 mg by mouth 2 (two) times daily as needed. For gout flare         . furosemide (LASIX) 40 MG tablet   Oral   Take 20-40 mg by mouth daily.          Marland Kitchen HYDROcodone-acetaminophen (NORCO) 10-325 MG per tablet   Oral   Take 1 tablet by mouth every 6 (six) hours as needed.   30 tablet   0   . levothyroxine (SYNTHROID, LEVOTHROID) 75 MCG tablet   Oral   Take 75 mcg by mouth daily.          . nitroGLYCERIN (NITROSTAT) 0.4 MG SL tablet   Sublingual   Place 0.4 mg under the tongue every 5 (five) minutes as needed. For chest pain         . pantoprazole (PROTONIX) 20 MG tablet   Oral   Take 20 mg by mouth daily.         . polyethylene glycol (MIRALAX / GLYCOLAX) packet   Oral   Take 17 g by mouth daily.   14 each   0   . EXPIRED: simvastatin (ZOCOR) 20 MG tablet   Oral   Take 1 tablet (20 mg total) by mouth daily at 6 PM.   30 tablet   11   . Tamsulosin HCl (FLOMAX) 0.4 MG CAPS   Oral   Take 0.4 mg by mouth at bedtime.          . traMADol (ULTRAM) 50 MG tablet   Oral   Take 1 tablet (50 mg total) by mouth every 6 (six) hours as needed for pain.   15 tablet   0     BP 126/55  Pulse 56  Temp(Src) 97.8 F (36.6 C) (Oral)  Resp 16  Ht 5\' 7"  (1.702 m)  Wt 140  lb (63.504 kg)  BMI 21.92 kg/m2  SpO2 96%  Physical Exam  Nursing note and vitals reviewed. Constitutional: He is oriented to person, place, and time. He appears well-developed and well-nourished. No distress.  HENT:  Head: Normocephalic and atraumatic.  Eyes: EOM are normal. Pupils are equal, round, and reactive to light.  Cardiovascular:  Normal rate.   Pulmonary/Chest: Effort normal.  Musculoskeletal:       Left hand: He exhibits tenderness and laceration. He exhibits normal range of motion and normal capillary refill. Normal sensation noted. Normal strength noted.       Hands: Normal tendon function  Neurological: He is alert and oriented to person, place, and time.  Skin: Skin is warm and dry.  Psychiatric: He has a normal mood and affect. His behavior is normal.    ED Course  Procedures (including critical care time)  Labs Reviewed - No data to display Dg Hand Complete Left  02/25/2013   *RADIOLOGY REPORT*  Clinical Data: Lacerations to index and middle fingers with a saw  LEFT HAND - COMPLETE 3+ VIEW  Comparison: 11/15/2012  Findings: Comminuted fractures at tufts of distal phalanges of left index and middle fingers. Associated bone fragment/debris and soft tissue deformities. Question tiny radiopaque foreign body at distal phalanx left middle finger. Scattered degenerative changes of the interphalangeal joints as well as the first and third MCP joints. No additional fracture, dislocation, or bone destruction. Degenerative changes at radial border of carpus. Osseous demineralization. Scattered vascular calcifications.  IMPRESSION: Comminuted fractures at tuft of distal phalanges of left index middle fingers. Question tiny radiopaque foreign body at tip of distal phalanx middle finger. Osseous demineralization with extensive degenerative changes as above.   Original Report Authenticated By: Ulyses Southward, M.D.    LACERATION REPAIR Performed by: Gwyneth Sprout Authorized by: Gwyneth Sprout Consent: Verbal consent obtained. Risks and benefits: risks, benefits and alternatives were discussed Consent given by: patient Patient identity confirmed: provided demographic data Prepped and Draped in normal sterile fashion Wound explored  Laceration Location: left 3rd finger  Laceration Length: 3cm  No Foreign Bodies seen or  palpated  Anesthesia: local infiltration  Local anesthetic: lidocaine 1% without epinephrine  Anesthetic total: 2 ml  Irrigation method: syringe Amount of cleaning: standard  Skin closure: 4.0 prolene  Number of sutures: 5  Technique: simple interrupted  Patient tolerance: Patient tolerated the procedure well with no immediate complications.  LACERATION REPAIR Performed by: Gwyneth Sprout Authorized byGwyneth Sprout Consent: Verbal consent obtained. Risks and benefits: risks, benefits and alternatives were discussed Consent given by: patient Patient identity confirmed: provided demographic data Prepped and Draped in normal sterile fashion Wound explored  Laceration Location: left 3rd finger tip  Laceration Length: 2cm  No Foreign Bodies seen or palpated  Anesthesia: digital block infiltration  Local anesthetic: lidocaine 1% without epinephrine  Anesthetic total: 2 ml  Irrigation method: syringe Amount of cleaning: standard  Skin closure: 4.0 prolene  Number of sutures: 3  Technique: simple interrupted.  Patient tolerance: Patient tolerated the procedure well with no immediate complications.   1. Finger laceration, initial encounter   2. Nailbed avulsion, initial encounter   3. Open fracture of tuft of distal phalanx of finger, initial encounter       MDM   Patient his hand today with a table saw lacerations to the left second third and fourth digits. The third and fourth digit has significant nail involvement but more of that nail and skin all wall and. There is no laceration to sew.  The areas were cleansed and wrapped with Xeroform gauze. The second digit has a laceration that was repaired as above. Tetanus shot is up to date. Normal tendon function and sensation.  Plain films shows multiple fx's that are open.  Will have pt f/u with hand surgery.      Gwyneth Sprout, MD 02/25/13 1239  Gwyneth Sprout, MD 02/25/13 1241  Gwyneth Sprout, MD 02/25/13 1301  Gwyneth Sprout, MD 02/25/13 1303

## 2013-03-27 ENCOUNTER — Encounter (INDEPENDENT_AMBULATORY_CARE_PROVIDER_SITE_OTHER): Payer: Self-pay

## 2013-05-27 ENCOUNTER — Encounter (INDEPENDENT_AMBULATORY_CARE_PROVIDER_SITE_OTHER): Payer: Self-pay

## 2013-06-11 ENCOUNTER — Other Ambulatory Visit (HOSPITAL_COMMUNITY): Payer: Self-pay | Admitting: *Deleted

## 2013-06-12 ENCOUNTER — Encounter (HOSPITAL_COMMUNITY): Payer: Self-pay | Admitting: Physical Medicine and Rehabilitation

## 2013-06-12 ENCOUNTER — Encounter (HOSPITAL_COMMUNITY)
Admission: RE | Admit: 2013-06-12 | Discharge: 2013-06-12 | Disposition: A | Discharge status: Home / Self Care | Payer: Medicare Other | Source: Ambulatory Visit | Attending: Nephrology | Admitting: Nephrology

## 2013-06-12 ENCOUNTER — Emergency Department (HOSPITAL_COMMUNITY)
Admission: EM | Admit: 2013-06-12 | Discharge: 2013-06-12 | Disposition: A | Discharge status: Home / Self Care | Payer: Medicare Other | Attending: Emergency Medicine | Admitting: Emergency Medicine

## 2013-06-12 DIAGNOSIS — G8929 Other chronic pain: Secondary | ICD-10-CM | POA: Insufficient documentation

## 2013-06-12 DIAGNOSIS — D509 Iron deficiency anemia, unspecified: Secondary | ICD-10-CM | POA: Insufficient documentation

## 2013-06-12 DIAGNOSIS — IMO0002 Reserved for concepts with insufficient information to code with codable children: Secondary | ICD-10-CM | POA: Insufficient documentation

## 2013-06-12 DIAGNOSIS — I2510 Atherosclerotic heart disease of native coronary artery without angina pectoris: Secondary | ICD-10-CM | POA: Insufficient documentation

## 2013-06-12 DIAGNOSIS — Y849 Medical procedure, unspecified as the cause of abnormal reaction of the patient, or of later complication, without mention of misadventure at the time of the procedure: Secondary | ICD-10-CM | POA: Insufficient documentation

## 2013-06-12 DIAGNOSIS — Z87891 Personal history of nicotine dependence: Secondary | ICD-10-CM | POA: Insufficient documentation

## 2013-06-12 DIAGNOSIS — E039 Hypothyroidism, unspecified: Secondary | ICD-10-CM | POA: Insufficient documentation

## 2013-06-12 DIAGNOSIS — Z79899 Other long term (current) drug therapy: Secondary | ICD-10-CM | POA: Insufficient documentation

## 2013-06-12 DIAGNOSIS — M109 Gout, unspecified: Secondary | ICD-10-CM | POA: Insufficient documentation

## 2013-06-12 DIAGNOSIS — Z8711 Personal history of peptic ulcer disease: Secondary | ICD-10-CM | POA: Insufficient documentation

## 2013-06-12 DIAGNOSIS — I951 Orthostatic hypotension: Secondary | ICD-10-CM | POA: Insufficient documentation

## 2013-06-12 DIAGNOSIS — N184 Chronic kidney disease, stage 4 (severe): Secondary | ICD-10-CM | POA: Insufficient documentation

## 2013-06-12 DIAGNOSIS — Z880 Allergy status to penicillin: Secondary | ICD-10-CM | POA: Insufficient documentation

## 2013-06-12 DIAGNOSIS — Z9189 Other specified personal risk factors, not elsewhere classified: Secondary | ICD-10-CM | POA: Insufficient documentation

## 2013-06-12 DIAGNOSIS — Z9861 Coronary angioplasty status: Secondary | ICD-10-CM | POA: Insufficient documentation

## 2013-06-12 DIAGNOSIS — Z792 Long term (current) use of antibiotics: Secondary | ICD-10-CM | POA: Insufficient documentation

## 2013-06-12 DIAGNOSIS — T7840XA Allergy, unspecified, initial encounter: Secondary | ICD-10-CM

## 2013-06-12 DIAGNOSIS — M1990 Unspecified osteoarthritis, unspecified site: Secondary | ICD-10-CM | POA: Insufficient documentation

## 2013-06-12 DIAGNOSIS — K219 Gastro-esophageal reflux disease without esophagitis: Secondary | ICD-10-CM | POA: Insufficient documentation

## 2013-06-12 DIAGNOSIS — Z7982 Long term (current) use of aspirin: Secondary | ICD-10-CM | POA: Insufficient documentation

## 2013-06-12 DIAGNOSIS — Z8673 Personal history of transient ischemic attack (TIA), and cerebral infarction without residual deficits: Secondary | ICD-10-CM | POA: Insufficient documentation

## 2013-06-12 DIAGNOSIS — N400 Enlarged prostate without lower urinary tract symptoms: Secondary | ICD-10-CM | POA: Insufficient documentation

## 2013-06-12 DIAGNOSIS — I509 Heart failure, unspecified: Secondary | ICD-10-CM | POA: Insufficient documentation

## 2013-06-12 DIAGNOSIS — I129 Hypertensive chronic kidney disease with stage 1 through stage 4 chronic kidney disease, or unspecified chronic kidney disease: Secondary | ICD-10-CM | POA: Insufficient documentation

## 2013-06-12 DIAGNOSIS — Z7902 Long term (current) use of antithrombotics/antiplatelets: Secondary | ICD-10-CM | POA: Insufficient documentation

## 2013-06-12 MED ORDER — SODIUM CHLORIDE 0.9 % IV SOLN
1020.0000 mg | Freq: Once | INTRAVENOUS | Status: AC
  Administered 2013-06-12: 1020 mg via INTRAVENOUS
  Filled 2013-06-12: qty 34

## 2013-06-12 NOTE — ED Notes (Signed)
Patient said he was here at [facility] receiving an Iron infusion due to anemia and he began to itch and his blood pressure dropped.  He advised they stopped the infusion and sent him here to the ED to be evaluated.  The patient's wife said the patient was seen by Dr. Allena Katz and this is who ordered the Iron infusion.  Patient denies pain, itching or SOB.

## 2013-06-12 NOTE — ED Notes (Signed)
Patient is alert and orientedx4.  Patient was explained discharge instructions and they understood them with no questions.  The patient's wife, Rhumell Bognar is taking the patient home.

## 2013-06-12 NOTE — Progress Notes (Signed)
Pt [facility] to medical day [facility] for his iron infusion.  He got a 1020 mg bag of feraheme infusion over 15 minutes.  His BP at 1350 was 132/66.  At 1455 pt stated that he was itching all over especially on his neck and chest and thighs.  The Infusion was finished at this time  His BP at 1458 was 80/41 and at 1500 it 78/35.  MD was called.  Dr Allena Katz wanted pt to go directly to the emergency room.  When Patient left at 1510 he stated that his itching was less and his BP had come up to 96/43.  Pt was a little unsteady on his feet.  Pts wife is with him.  I spoke to charge nurse in ed.

## 2013-06-12 NOTE — ED Provider Notes (Signed)
CSN: [id number]     Arrival date & time 06/12/13  1510 History  This chart was scribed for non-physician practitioner, Junious Silk, PA-C working with Shelda Jakes, MD by Greggory Stallion, ED scribe. This patient was seen in room TR08C/TR08C and the patient's care was started at 4:02 PM.   Chief Complaint  Patient presents with  . Pruritis  . Hypotension   The history is provided by the patient. No language interpreter was used.    HPI Comments: [name] is a 79 y.o. male who presents to the Emergency Department complaining of hypotension and pruritis. Pt states he was in short stay earlier today receiving an iron infusion. He states the itching was around his neck and his blood pressure dropped to 78/35 while getting the infusion. The episode lasted about one hour and resolved on its own. He states he was receiving iron because when his levels were previously checked, they were very low. Pt denies pallor, SOB and melena. His symptoms have completely resolved and he would like to go home. He reports that he "didn't need iron in the first place" and was riding his backhoe all day yesterday.   Past Medical History  Diagnosis Date  . CAD (coronary artery disease)   . HTN (hypertension)   . Peptic ulcer disease   . BPH (benign prostatic hyperplasia)   . Insomnia   . GERD (gastroesophageal reflux disease)   . CHF (congestive heart failure)   . Hyperlipidemia   . Hypothyroidism   . H/O hiatal hernia   . Osteoarthritis     GOUT  . DDD (degenerative disc disease), lumbar   . Exertional shortness of breath     "@ times" (09/25/2012)  . History of blood transfusion     "related to bleeding ulcers" (09/25/2012)  . Lower GI bleeding ?2009    "once; had it cauterized" (09/25/2012)  . Stroke ?2003    denies residual (09/25/2012)  . Chronic lower back pain   . Gout     "once in awhile" (09/25/2012)  . Chronic kidney disease (CKD), stage IV (severe)    Past Surgical History  Procedure  Laterality Date  . Vagotomy and pyloroplasty  1970's?    "bleeding ulcers" (09/25/2012)  . S/p decompressive laminectomy,medial facetectomy and forminotomy l4-l5    . Great toe chilectomy left  ?1970's  . Hemoclipped gastric ulcer  2009  . Lumbar epidural steroid injection l2-3  2010  . Coronary angioplasty with stent placement  09/20/1997; ~ 40981    "2 + 2; total of 4" (09/25/2012)DR H SMITH   . Cataract extraction w/ intraocular lens implant  ~ 2009    RIGHT EYE  . Hernia repair  09/25/2012    VHR w/mesh (09/25/2012)  . Toe surgery      "cut one of my toes off and had it reattached; ? toe" (09/25/2012)  . Brow lift  2000's    "both" (09/25/2012)  . Dupuytren contracture release       right palm/notes (09/25/2012)  . Ventral hernia repair  09/25/2012    Procedure: LAPAROSCOPIC VENTRAL HERNIA;  Surgeon: Clovis Pu. Cornett, MD;  Location: [facility] OR;  Service: General;  Laterality: N/A;  laparoscopic incisional hernia repair with mesh  . Insertion of mesh  09/25/2012    Procedure: INSERTION OF MESH;  Surgeon: Clovis Pu. Cornett, MD;  Location: [facility] OR;  Service: General;  Laterality: N/A;   History reviewed. No pertinent family history. History  Substance Use Topics  .  Smoking status: Former Smoker -- 0.12 packs/day for 5 years    Types: Cigarettes    Quit date: 07/20/1954  . Smokeless tobacco: Never Used  . Alcohol Use: Yes     Comment: 09/25/2012 "last alcohol was in 1949; have had a drink or 2"    Review of Systems  Respiratory: Negative for shortness of breath.   Gastrointestinal:       Denies dark stool.  Skin: Negative for pallor and rash.  All other systems reviewed and are negative.    Allergies  Penicillins; Allopurinol; and Benicar  Home Medications   Current Outpatient Rx  Name  Route  Sig  Dispense  Refill  . aspirin EC 81 MG tablet   Oral   Take 81 mg by mouth daily.         Marland Kitchen atenolol (TENORMIN) 50 MG tablet   Oral   Take 25 mg by mouth daily.         . cephALEXin  (KEFLEX) 500 MG capsule   Oral   Take 1 capsule (500 mg total) by mouth 4 (four) times daily.   20 capsule   0   . cholecalciferol (VITAMIN D) 1000 UNITS tablet   Oral   Take 1,000 Units by mouth daily with breakfast.          . ciprofloxacin (CIPRO) 500 MG tablet   Oral   Take 0.5 tablets (250 mg total) by mouth 2 (two) times daily.   14 tablet   0   . clopidogrel (PLAVIX) 75 MG tablet   Oral   Take 1 tablet (75 mg total) by mouth daily.   30 tablet   0     Restart on friday   . Coenzyme Q10 (COQ-10) 100 MG CAPS   Oral   Take 1 capsule by mouth daily with breakfast.          . colchicine 0.6 MG tablet   Oral   Take 0.6 mg by mouth 2 (two) times daily as needed. For gout flare         . furosemide (LASIX) 40 MG tablet   Oral   Take 20-40 mg by mouth daily.          Marland Kitchen HYDROcodone-acetaminophen (NORCO) 10-325 MG per tablet   Oral   Take 1 tablet by mouth every 6 (six) hours as needed.   30 tablet   0   . levothyroxine (SYNTHROID, LEVOTHROID) 75 MCG tablet   Oral   Take 75 mcg by mouth daily.          . nitroGLYCERIN (NITROSTAT) 0.4 MG SL tablet   Sublingual   Place 0.4 mg under the tongue every 5 (five) minutes as needed. For chest pain         . pantoprazole (PROTONIX) 20 MG tablet   Oral   Take 20 mg by mouth daily.         . polyethylene glycol (MIRALAX / GLYCOLAX) packet   Oral   Take 17 g by mouth daily.   14 each   0   . EXPIRED: simvastatin (ZOCOR) 20 MG tablet   Oral   Take 1 tablet (20 mg total) by mouth daily at 6 PM.   30 tablet   11   . Tamsulosin HCl (FLOMAX) 0.4 MG CAPS   Oral   Take 0.4 mg by mouth at bedtime.          . traMADol (ULTRAM) 50 MG tablet   Oral  Take 1 tablet (50 mg total) by mouth every 6 (six) hours as needed for pain.   15 tablet   0    BP 134/58  Pulse 63  Temp(Src) 98.7 F (37.1 C) (Oral)  Resp 18  SpO2 97%  Physical Exam  Nursing note and vitals reviewed. Constitutional: He is  oriented to person, place, and time. He appears well-developed and well-nourished. No distress.  HENT:  Head: Normocephalic and atraumatic.  Right Ear: External ear normal.  Left Ear: External ear normal.  Nose: Nose normal.  Eyes: Conjunctivae are normal.  Neck: Normal range of motion. No tracheal deviation present.  Cardiovascular: Normal rate, regular rhythm and normal heart sounds.   Pulmonary/Chest: Effort normal and breath sounds normal. No stridor.  Abdominal: Soft. He exhibits no distension. There is no tenderness.  Musculoskeletal: Normal range of motion.  Neurological: He is alert and oriented to person, place, and time.  Skin: Skin is warm and dry. He is not diaphoretic.  Psychiatric: He has a normal mood and affect. His behavior is normal.    ED Course  Procedures (including critical care time)  DIAGNOSTIC STUDIES: Oxygen Saturation is 97% on RA, normal by my interpretation.    COORDINATION OF CARE: 4:06 PM-Discussed treatment plan which includes speaking with Dr. Deretha Emory and possible blood work with pt at bedside and pt agreed to plan.   Labs Review Labs Reviewed - No data to display Imaging Review No results found.  MDM   1. Allergic reaction, initial encounter    Patient evaluated prior to dc, is hemodynamically stable, in no respiratory distress, and denies the feeling of throat closing. Pt has been advised to take OTC benadryl & return to the ED if they have a mod-severe allergic rxn (s/s including throat closing, difficulty breathing, swelling of lips face or tongue). Pt is to follow up with their PCP. Pt is agreeable with plan & verbalizes understanding. Patient denies any feelings of shortness of breath, fatigue, pallor. He is ready and shows interest in going home. He does not want additional labs today. He has close follow up after discharge from ED. Dr. Deretha Emory evaluated patient and agrees with plan.     I personally performed the services described  in this documentation, which was scribed in my presence. The recorded information has been reviewed and is accurate.    Mora Bellman, PA-C 06/12/13 1623

## 2013-06-12 NOTE — ED Notes (Signed)
Pt presents to department for evaluation of itching around neck and hypotension. Pt was in short stay this morning receiving iron infusion, states he began itching around neck after treatment. Also states hypotension (78/35). Upon arrival pt states he feels much better, no itching, BP stable. He is alert and oriented x4. NAD.

## 2013-06-12 NOTE — ED Provider Notes (Signed)
Medical screening examination/treatment/procedure(s) were conducted as a shared visit with non-physician practitioner(s) and myself.  I personally evaluated the patient during the encounter  Patient seen by me. Patient sent over from outpatient procedures Center patient was receiving an iron infusion in itching around the neck after treatment. Supposedly had a drop in blood pressure to 70/35. Patient sent over here for further evaluation all symptoms have resolved no lip swelling no tongue swelling no shortness of breath no itching no hives no rash no further blood pressure problems. Patient remains alert and oriented. Patient can be discharged home with precautions on when to return.     Shelda Jakes, MD 06/12/13 [phone]

## 2013-07-04 ENCOUNTER — Encounter: Payer: Self-pay | Admitting: *Deleted

## 2013-07-04 ENCOUNTER — Encounter: Payer: Self-pay | Admitting: Interventional Cardiology

## 2013-07-04 DIAGNOSIS — R0989 Other specified symptoms and signs involving the circulatory and respiratory systems: Secondary | ICD-10-CM | POA: Insufficient documentation

## 2013-07-04 DIAGNOSIS — M109 Gout, unspecified: Secondary | ICD-10-CM | POA: Insufficient documentation

## 2013-07-04 DIAGNOSIS — I639 Cerebral infarction, unspecified: Secondary | ICD-10-CM | POA: Insufficient documentation

## 2013-07-07 DIAGNOSIS — I5032 Chronic diastolic (congestive) heart failure: Secondary | ICD-10-CM | POA: Insufficient documentation

## 2013-07-08 ENCOUNTER — Encounter: Payer: Self-pay | Admitting: Interventional Cardiology

## 2013-07-08 ENCOUNTER — Ambulatory Visit (INDEPENDENT_AMBULATORY_CARE_PROVIDER_SITE_OTHER): Payer: Medicare Other | Admitting: Interventional Cardiology

## 2013-07-08 VITALS — BP 140/60 | HR 49 | Ht 67.0 in | Wt 148.0 lb

## 2013-07-08 DIAGNOSIS — I5032 Chronic diastolic (congestive) heart failure: Secondary | ICD-10-CM

## 2013-07-08 DIAGNOSIS — N184 Chronic kidney disease, stage 4 (severe): Secondary | ICD-10-CM

## 2013-07-08 DIAGNOSIS — I10 Essential (primary) hypertension: Secondary | ICD-10-CM

## 2013-07-08 DIAGNOSIS — I2510 Atherosclerotic heart disease of native coronary artery without angina pectoris: Secondary | ICD-10-CM

## 2013-07-08 NOTE — Progress Notes (Signed)
Patient ID: [name], male   DOB: 1923/09/10, 79 y.o.   MRN: [id number]    [address], [address] [place], [place]  [address] Phone: [phone] Fax:  [phone]  Date:  07/08/2013   ID:  [name], DOB Feb 08, 1923, MRN [id number]  PCP:  Pearla Dubonnet, MD   ASSESSMENT:  1. chronic diastolic heart failure, stable  2. Hypertension, controlled  3. Coronary artery disease, stable   PLAN:  1. No change in current medical regimen.  2. 1 one-year clinical followup   SUBJECTIVE: [name] is a 79 y.o. male who is doing well. He has occasional fleeting chest pain. Rarely uses a supplemental nitroglycerin. He denies dyspnea. No orthopnea PND. There is no peripheral edema. No medication side effects.   Wt Readings from Last 3 Encounters:  07/08/13 148 lb (67.132 kg)  02/25/13 140 lb (63.504 kg)  12/18/12 145 lb (65.772 kg)     Past Medical History  Diagnosis Date  . CAD (coronary artery disease)      S/P stents RCA X 4 1999; EF 64%, no ischemia, Cardiolite 11/2009 - Dr. Verdis Prime - Dr. Donata Clay  . HTN (hypertension)   . Peptic ulcer disease   . BPH (benign prostatic hyperplasia)   . Insomnia   . GERD (gastroesophageal reflux disease)   . CHF (congestive heart failure)   . Hyperlipidemia   . Hypothyroidism   . H/O hiatal hernia   . Osteoarthritis     GOUT  . DDD (degenerative disc disease), lumbar   . Exertional shortness of breath     "@ times" (09/25/2012)  . History of blood transfusion     "related to bleeding ulcers" (09/25/2012)  . Lower GI bleeding ?2009    "once; had it cauterized" (09/25/2012)  . Stroke ?2003    denies residual (09/25/2012)  . Chronic lower back pain   . Gout     "once in awhile" (09/25/2012)  . Chronic kidney disease (CKD), stage IV (severe)     Stage IV - Dr. Kathe Mariner, December, 2013 - May, 2014  . Carotid bruit     with only 20-30% blockage bilaterally on carotid Dopplers in 2012    Current Outpatient  Prescriptions  Medication Sig Dispense Refill  . aspirin EC 81 MG tablet Take 81 mg by mouth daily.      Marland Kitchen atenolol (TENORMIN) 50 MG tablet Take 25 mg by mouth at bedtime.       . cholecalciferol (VITAMIN D) 1000 UNITS tablet Take 1,000 Units by mouth daily with breakfast.       . clopidogrel (PLAVIX) 75 MG tablet Take 75 mg by mouth daily.      . Coenzyme Q10 (COQ-10) 100 MG CAPS Take 1 capsule by mouth daily with breakfast.       . colchicine 0.6 MG tablet Take 0.6 mg by mouth 2 (two) times daily as needed. For gout flare      . Cyanocobalamin (VITAMIN B-12 PO) Take 1 capsule by mouth at bedtime.      . furosemide (LASIX) 40 MG tablet Take 20-40 mg by mouth daily.       Marland Kitchen HYDROcodone-acetaminophen (NORCO) 10-325 MG per tablet Take 1 tablet by mouth every 6 (six) hours as needed for pain.      Marland Kitchen levothyroxine (SYNTHROID, LEVOTHROID) 75 MCG tablet Take 75 mcg by mouth daily.       . nitroGLYCERIN (NITROSTAT) 0.4 MG SL tablet Place 0.4  mg under the tongue every 5 (five) minutes as needed. For chest pain      . pantoprazole (PROTONIX) 20 MG tablet Take 20 mg by mouth daily.      . Tamsulosin HCl (FLOMAX) 0.4 MG CAPS Take 0.4 mg by mouth daily.        No current facility-administered medications for this visit.    Allergies:    Allergies  Allergen Reactions  . Penicillins Hives  . Allopurinol Other (See Comments)    Unknown   . Benicar [Olmesartan Medoxomil] Other (See Comments)    unknown    Social History:  The patient  reports that he quit smoking about 59 years ago. His smoking use included Cigarettes. He has a .6 pack-year smoking history. He has never used smokeless tobacco. He reports that he drinks alcohol. He reports that he does not use illicit drugs.   ROS:  Please see the history of present illness.   No transient neurological symptoms. No recent falls. Appetite is stable.   All other systems reviewed and negative.   OBJECTIVE: VS:  BP 140/60  Pulse 49  Ht 5\' 7"  (1.702  m)  Wt 148 lb (67.132 kg)  BMI 23.17 kg/m2  SpO2 96% Well nourished, well developed, in no acute distress, appears stated age of 41 HEENT: normal Neck: JVD none. Carotid bruit bilateral  Cardiac:  normal S1, S2; RRR; 2-3 of 6 crescendo decrescendo murmur of aortic stenosis/sclerosis. murmur Lungs:  clear to auscultation bilaterally, no wheezing, rhonchi or rales Abd: soft, nontender, no hepatomegaly Ext: Edema none. Pulses faint bilateral pedal 2+ and symmetric radials Skin: warm and dry Neuro:  CNs 2-12 intact, no focal abnormalities noted  EKG:  None performed  Signed, Darci Needle III, MD 07/08/2013 10:46 AM

## 2013-07-08 NOTE — Patient Instructions (Signed)
Your physician recommends that you continue on your current medications as directed. Please refer to the Current Medication list given to you today.  Your physician wants you to follow-up in: 1 year with Dr.Smith You will receive a reminder letter in the mail two months in advance. If you don't receive a letter, please call our office to schedule the follow-up appointment.  

## 2013-12-04 ENCOUNTER — Emergency Department (HOSPITAL_BASED_OUTPATIENT_CLINIC_OR_DEPARTMENT_OTHER)
Admission: EM | Admit: 2013-12-04 | Discharge: 2013-12-04 | Disposition: A | Discharge status: Home / Self Care | Payer: Medicare Other | Attending: Emergency Medicine | Admitting: Emergency Medicine

## 2013-12-04 ENCOUNTER — Emergency Department (HOSPITAL_BASED_OUTPATIENT_CLINIC_OR_DEPARTMENT_OTHER): Payer: Medicare Other

## 2013-12-04 ENCOUNTER — Encounter (HOSPITAL_BASED_OUTPATIENT_CLINIC_OR_DEPARTMENT_OTHER): Payer: Self-pay | Admitting: Emergency Medicine

## 2013-12-04 DIAGNOSIS — N184 Chronic kidney disease, stage 4 (severe): Secondary | ICD-10-CM | POA: Insufficient documentation

## 2013-12-04 DIAGNOSIS — Z79899 Other long term (current) drug therapy: Secondary | ICD-10-CM | POA: Insufficient documentation

## 2013-12-04 DIAGNOSIS — I509 Heart failure, unspecified: Secondary | ICD-10-CM | POA: Insufficient documentation

## 2013-12-04 DIAGNOSIS — W230XXA Caught, crushed, jammed, or pinched between moving objects, initial encounter: Secondary | ICD-10-CM | POA: Insufficient documentation

## 2013-12-04 DIAGNOSIS — Y929 Unspecified place or not applicable: Secondary | ICD-10-CM | POA: Insufficient documentation

## 2013-12-04 DIAGNOSIS — M1990 Unspecified osteoarthritis, unspecified site: Secondary | ICD-10-CM | POA: Insufficient documentation

## 2013-12-04 DIAGNOSIS — E039 Hypothyroidism, unspecified: Secondary | ICD-10-CM | POA: Insufficient documentation

## 2013-12-04 DIAGNOSIS — G8929 Other chronic pain: Secondary | ICD-10-CM | POA: Insufficient documentation

## 2013-12-04 DIAGNOSIS — Z87891 Personal history of nicotine dependence: Secondary | ICD-10-CM | POA: Insufficient documentation

## 2013-12-04 DIAGNOSIS — I129 Hypertensive chronic kidney disease with stage 1 through stage 4 chronic kidney disease, or unspecified chronic kidney disease: Secondary | ICD-10-CM | POA: Insufficient documentation

## 2013-12-04 DIAGNOSIS — Z7982 Long term (current) use of aspirin: Secondary | ICD-10-CM | POA: Insufficient documentation

## 2013-12-04 DIAGNOSIS — Z7902 Long term (current) use of antithrombotics/antiplatelets: Secondary | ICD-10-CM | POA: Insufficient documentation

## 2013-12-04 DIAGNOSIS — S62609B Fracture of unspecified phalanx of unspecified finger, initial encounter for open fracture: Secondary | ICD-10-CM | POA: Insufficient documentation

## 2013-12-04 DIAGNOSIS — Z880 Allergy status to penicillin: Secondary | ICD-10-CM | POA: Insufficient documentation

## 2013-12-04 DIAGNOSIS — M109 Gout, unspecified: Secondary | ICD-10-CM | POA: Insufficient documentation

## 2013-12-04 DIAGNOSIS — Y9389 Activity, other specified: Secondary | ICD-10-CM | POA: Insufficient documentation

## 2013-12-04 DIAGNOSIS — S61209A Unspecified open wound of unspecified finger without damage to nail, initial encounter: Secondary | ICD-10-CM | POA: Insufficient documentation

## 2013-12-04 DIAGNOSIS — Z8673 Personal history of transient ischemic attack (TIA), and cerebral infarction without residual deficits: Secondary | ICD-10-CM | POA: Insufficient documentation

## 2013-12-04 DIAGNOSIS — K219 Gastro-esophageal reflux disease without esophagitis: Secondary | ICD-10-CM | POA: Insufficient documentation

## 2013-12-04 DIAGNOSIS — S61319A Laceration without foreign body of unspecified finger with damage to nail, initial encounter: Secondary | ICD-10-CM

## 2013-12-04 DIAGNOSIS — Z9861 Coronary angioplasty status: Secondary | ICD-10-CM | POA: Insufficient documentation

## 2013-12-04 DIAGNOSIS — Z8711 Personal history of peptic ulcer disease: Secondary | ICD-10-CM | POA: Insufficient documentation

## 2013-12-04 DIAGNOSIS — I2510 Atherosclerotic heart disease of native coronary artery without angina pectoris: Secondary | ICD-10-CM | POA: Insufficient documentation

## 2013-12-04 MED ORDER — HYDROCODONE-ACETAMINOPHEN 5-325 MG PO TABS
1.0000 | ORAL_TABLET | Freq: Once | ORAL | Status: AC
  Administered 2013-12-04: 1 via ORAL
  Filled 2013-12-04: qty 1

## 2013-12-04 MED ORDER — CEPHALEXIN 250 MG PO CAPS
250.0000 mg | ORAL_CAPSULE | Freq: Once | ORAL | Status: AC
  Administered 2013-12-04: 250 mg via ORAL
  Filled 2013-12-04: qty 1

## 2013-12-04 MED ORDER — CEPHALEXIN 500 MG PO CAPS: 500.0000 mg | ORAL_CAPSULE | Freq: Four times a day (QID) | ORAL | Status: DC

## 2013-12-04 MED ORDER — HYDROCODONE-ACETAMINOPHEN 5-325 MG PO TABS: 2.0000 | ORAL_TABLET | ORAL | Status: DC | PRN

## 2013-12-04 NOTE — Discharge Instructions (Signed)
Keep splint on. You may change the dressing after 24 hours. Called Dr. Amanda Pea, hand surgeon, for appointment in one week.  Finger Fracture Fractures of fingers are breaks in the bones of the fingers. There are many types of fractures. There are different ways of treating these fractures. Your health care provider will discuss the best way to treat your fracture. CAUSES Traumatic injury is the main cause of broken fingers. These include:  Injuries while playing sports.  Workplace injuries.  Falls. RISK FACTORS Activities that can increase your risk of finger fractures include:  Sports.  Workplace activities that involve machinery.  A condition called osteoporosis, which can make your bones less dense and cause them to fracture more easily. SIGNS AND SYMPTOMS The main symptoms of a broken finger are pain and swelling within 15 minutes after the injury. Other symptoms include:  Bruising of your finger.  Stiffness of your finger.  Numbness of your finger.  Exposed bones (compound fracture) if the fracture is severe. DIAGNOSIS  The best way to diagnose a broken bone is with X-ray imaging. Additionally, your health care provider will use this X-ray image to evaluate the position of the broken finger bones.  TREATMENT  Finger fractures can be treated with:   Nonreduction This means the bones are in place. The finger is splinted without changing the positions of the bone pieces. The splint is usually left on for about a week to 10 days. This will depend on your fracture and what your health care provider thinks.  Closed reduction The bones are put back into position without using surgery. The finger is then splinted.  Open reduction and internal fixation The fracture site is opened. Then the bone pieces are fixed into place with pins or some type of hardware. This is seldom required. It depends on the severity of the fracture. HOME CARE INSTRUCTIONS   Follow your health care  provider's instructions regarding activities, exercises, and physical therapy.  Only take over-the-counter or prescription medicines for pain, discomfort, or fever as directed by your health care provider. SEEK MEDICAL CARE IF: You have pain or swelling that limits the motion or use of your fingers. SEEK IMMEDIATE MEDICAL CARE IF:  Your finger becomes numb. MAKE SURE YOU:   Understand these instructions.  Will watch your condition.  Will get help right away if you are not doing well or get worse. Document Released: 12/18/2000 Document Revised: 06/26/2013 Document Reviewed: 04/17/2013 [facility] Patient Information 2014 [place], [place].  Fingernail or Toenail Loss All or part of your fingernail or toenail has been lost. This may or may not grow back as a normal nail. A special non-stick bandage has been put on your finger or toe tightly to prevent bleeding. HOME CARE INSTRUCTIONS  The tips of fingers and toes are full of nerves and injuries are often very painful. The following will help you decrease the pain and obtain the best outcome.  Keep your hand or foot elevated above your heart to relieve pain and swelling. This will require lying in bed or on a couch with the hand or leg on pillows or sitting in a recliner with the leg up. Letting your hand or leg dangle may increase swelling, slow healing and cause throbbing pain.  Keep your dressing dry and clean.  Change your bandage in 24 hours after going home.  After your bandage is changed, soak your hand or foot in warm soapy water for 10 to 20 minutes. Do this 3 times per day.  This helps reduce pain and swelling. After soaking, apply a clean, dry bandage. Change your bandage if it is wet or dirty.  Only take over-the-counter or prescription medicines for pain, discomfort, or fever as directed by your caregiver.  See your caregiver as needed for problems. SEEK IMMEDIATE MEDICAL CARE IF:   You have increased pain, swelling, drainage,  or bleeding.  You have a fever. MAKE SURE YOU:   Understand these instructions.  Will watch your condition.  Will get help right away if you are not doing well or get worse. Document Released: 07/28/2006 Document Revised: 11/28/2011 Document Reviewed: 10/17/2006 [facility] Patient Information 2014 [place], [place].

## 2013-12-04 NOTE — ED Notes (Signed)
Pt reports that he smashed his finger between a steel bolt and a steel plate today.  Noted to have (R) pointer finger laceration and bleeding.  Pressure dressing applied.

## 2013-12-04 NOTE — ED Provider Notes (Signed)
CSN: [id number]     Arrival date & time 12/04/13  1723 History   First MD Initiated Contact with Patient 12/04/13 1746     Chief Complaint  Patient presents with  . Finger Injury      HPI  80 year old male swinging a hammer. Denyse Dago struck a pipe he was getting deflected off. His right index finger struck a piece of metal protruding from the pipe. He has a nailbed injury to his right index finger.  Past Medical History  Diagnosis Date  . CAD (coronary artery disease)      S/P stents RCA X 4 1999; EF 64%, no ischemia, Cardiolite 11/2009 - Dr. Verdis Prime - Dr. Donata Clay  . HTN (hypertension)   . Peptic ulcer disease   . BPH (benign prostatic hyperplasia)   . Insomnia   . GERD (gastroesophageal reflux disease)   . CHF (congestive heart failure)   . Hyperlipidemia   . Hypothyroidism   . H/O hiatal hernia   . Osteoarthritis     GOUT  . DDD (degenerative disc disease), lumbar   . Exertional shortness of breath     "@ times" (09/25/2012)  . History of blood transfusion     "related to bleeding ulcers" (09/25/2012)  . Lower GI bleeding ?2009    "once; had it cauterized" (09/25/2012)  . Stroke ?2003    denies residual (09/25/2012)  . Chronic lower back pain   . Gout     "once in awhile" (09/25/2012)  . Chronic kidney disease (CKD), stage IV (severe)     Stage IV - Dr. Kathe Mariner, December, 2013 - May, 2014  . Carotid bruit     with only 20-30% blockage bilaterally on carotid Dopplers in 2012   Past Surgical History  Procedure Laterality Date  . Vagotomy and pyloroplasty  1970's?    "bleeding ulcers" (09/25/2012)  . S/p decompressive laminectomy,medial facetectomy and forminotomy l4-l5    . Great toe chilectomy left  ?1970's  . Hemoclipped gastric ulcer  2009  . Lumbar epidural steroid injection l2-3  2010  . Coronary angioplasty with stent placement  09/20/1997; ~ 40981    "2 + 2; total of 4" (09/25/2012)DR H SMITH   . Cataract extraction w/ intraocular lens implant  ~ 2009    RIGHT EYE  .  Hernia repair  09/25/2012    VHR w/mesh (09/25/2012)  . Toe surgery      "cut one of my toes off and had it reattached; ? toe" (09/25/2012)  . Brow lift  2000's    "both" (09/25/2012)  . Dupuytren contracture release       right palm/notes (09/25/2012)  . Ventral hernia repair  09/25/2012    Procedure: LAPAROSCOPIC VENTRAL HERNIA;  Surgeon: Clovis Pu. Cornett, MD;  Location: [facility] OR;  Service: General;  Laterality: N/A;  laparoscopic incisional hernia repair with mesh  . Insertion of mesh  09/25/2012    Procedure: INSERTION OF MESH;  Surgeon: Clovis Pu. Cornett, MD;  Location: [facility] OR;  Service: General;  Laterality: N/A;   History reviewed. No pertinent family history. History  Substance Use Topics  . Smoking status: Former Smoker -- 0.12 packs/day for 5 years    Types: Cigarettes    Quit date: 07/20/1954  . Smokeless tobacco: Never Used  . Alcohol Use: Yes     Comment: 09/25/2012 "last alcohol was in 1949; have had a drink or 2"    Review of Systems  Musculoskeletal:  Open injury to the right index finger nailbed      Allergies  Penicillins; Allopurinol; and Benicar  Home Medications   Current Outpatient Rx  Name  Route  Sig  Dispense  Refill  . aspirin EC 81 MG tablet   Oral   Take 81 mg by mouth daily.         Marland Kitchen. atenolol (TENORMIN) 50 MG tablet   Oral   Take 25 mg by mouth at bedtime.          . cephALEXin (KEFLEX) 500 MG capsule   Oral   Take 1 capsule (500 mg total) by mouth 4 (four) times daily.   20 capsule   0   . cholecalciferol (VITAMIN D) 1000 UNITS tablet   Oral   Take 1,000 Units by mouth daily with breakfast.          . clopidogrel (PLAVIX) 75 MG tablet   Oral   Take 75 mg by mouth daily.         . Coenzyme Q10 (COQ-10) 100 MG CAPS   Oral   Take 1 capsule by mouth daily with breakfast.          . colchicine 0.6 MG tablet   Oral   Take 0.6 mg by mouth 2 (two) times daily as needed. For gout flare         . Cyanocobalamin (VITAMIN B-12 PO)    Oral   Take 1 capsule by mouth at bedtime.         . furosemide (LASIX) 40 MG tablet   Oral   Take 20-40 mg by mouth daily.          Marland Kitchen. HYDROcodone-acetaminophen (NORCO) 10-325 MG per tablet   Oral   Take 1 tablet by mouth every 6 (six) hours as needed for pain.         Marland Kitchen. HYDROcodone-acetaminophen (NORCO/VICODIN) 5-325 MG per tablet   Oral   Take 2 tablets by mouth every 4 (four) hours as needed.   10 tablet   0   . levothyroxine (SYNTHROID, LEVOTHROID) 75 MCG tablet   Oral   Take 75 mcg by mouth daily.          . nitroGLYCERIN (NITROSTAT) 0.4 MG SL tablet   Sublingual   Place 0.4 mg under the tongue every 5 (five) minutes as needed. For chest pain         . pantoprazole (PROTONIX) 20 MG tablet   Oral   Take 20 mg by mouth daily.         . Tamsulosin HCl (FLOMAX) 0.4 MG CAPS   Oral   Take 0.4 mg by mouth daily.           BP 140/62  Pulse 62  Temp(Src) 97.6 F (36.4 C) (Oral)  Resp 18  Ht 5\' 7"  (1.702 m)  Wt 145 lb (65.772 kg)  BMI 22.71 kg/m2  SpO2 100% Physical Exam  Musculoskeletal:  Normal range of motion at the wrist into the hand. This is with the exception of the PIP to the right index finger.  There is a laceration with near complete avulsion of the nail from the nail bed. There is a porcelain cement or laceration. There is a tip and volar laceration is 1 cm that extends into the nailbed.    ED Course  LACERATION REPAIR Date/Time: 12/04/2013 7:06 PM Performed by: Rolland PorterJAMES, Kylar Leonhardt Authorized by: Rolland PorterJAMES, Betsi Crespi Consent: Verbal consent obtained. Consent given by: patient Patient understanding: patient  states understanding of the procedure being performed Patient identity confirmed: verbally with patient Body area: upper extremity Location details: right index finger Laceration length: 6 cm Tendon involvement: none Nerve involvement: none Vascular damage: no Anesthesia: digital block Local anesthetic: lidocaine 2% without  epinephrine Irrigation solution: saline Irrigation method: syringe Amount of cleaning: standard Debridement: Debridement of some loose cancellous bone. Degree of undermining: none Skin closure: 4-0 nylon Subcutaneous closure: 5-0 Vicryl (Nailbed repair with 5-0 Vicryl) Technique: simple and complex Approximation: close Approximation difficulty: complex Dressing: gauze roll Comments: [place] in foam splint and extension at the DIP   (including critical care time) Labs Review Labs Reviewed - No data to display Imaging Review Dg Finger Index Right  12/04/2013   CLINICAL DATA:  Finger injury with a hammer.  Bleeding.  EXAM: RIGHT INDEX FINGER 2+V  COMPARISON:  None.  FINDINGS: Transverse fracture, distal metaphysis of the distal phalanx. Severe spurring of the base of the distal phalanx, with age indeterminate fragmentation of the anterior spur. Prominent loss of articular space.  There is considerable spurring of the proximal interphalangeal joint. Mild irregularity of the proximal interphalangeal joint posteriorly along the cortical margin is probably incidental.  Degenerative arthropathy in the adjacent visualized fingers noted.  IMPRESSION: 1. Transverse fracture, distal metaphysis of the distal phalanx of the index finger. 2. Severe degenerative arthropathy in the visualized fingers. 3. Anterior fragmentation of spurring along the base of the distal phalanx of the index finger is probably chronic but could conceivably be acute.   Electronically Signed   By: Herbie Baltimore M.D.   On: 12/04/2013 18:17     EKG Interpretation None      MDM   Final diagnoses:  Open finger fracture  Nailbed laceration, finger    X-rays show a transverse P3 fracture. After the injury was anesthetized, it was irrigated and explored. A few small pieces of cancellous bone were debrided.  Some obviously nonviable skin that was denuded was debrided. The nailbed was repaired with 4 5-0 Vicryl sutures. The  surrounding lacerations totaling 2 cm were repaired with 7 simple interrupted 4-0 black nylon sutures. The nail was then replaced into the eponychial fold and secured with 4 interrupted 5-0 nylon sutures at the corners.. The wound was dressed. The finger was splinted at the DIP joint in extension.   Rolland Porter, MD 12/04/13 Windell Moment

## 2014-06-13 ENCOUNTER — Other Ambulatory Visit: Payer: Self-pay | Admitting: Internal Medicine

## 2014-06-13 DIAGNOSIS — H5316 Psychophysical visual disturbances: Secondary | ICD-10-CM

## 2014-06-16 ENCOUNTER — Ambulatory Visit
Admission: RE | Admit: 2014-06-16 | Discharge: 2014-06-16 | Disposition: A | Discharge status: Home / Self Care | Payer: Medicare Other | Source: Ambulatory Visit | Attending: Internal Medicine | Admitting: Internal Medicine

## 2014-06-16 DIAGNOSIS — H5316 Psychophysical visual disturbances: Secondary | ICD-10-CM

## 2014-07-21 ENCOUNTER — Encounter: Payer: Self-pay | Admitting: Interventional Cardiology

## 2014-07-21 ENCOUNTER — Ambulatory Visit (INDEPENDENT_AMBULATORY_CARE_PROVIDER_SITE_OTHER): Admitting: Interventional Cardiology

## 2014-07-21 VITALS — BP 110/70 | HR 61 | Ht 67.0 in | Wt 155.0 lb

## 2014-07-21 DIAGNOSIS — I10 Essential (primary) hypertension: Secondary | ICD-10-CM

## 2014-07-21 DIAGNOSIS — I5032 Chronic diastolic (congestive) heart failure: Secondary | ICD-10-CM

## 2014-07-21 DIAGNOSIS — N184 Chronic kidney disease, stage 4 (severe): Secondary | ICD-10-CM

## 2014-07-21 DIAGNOSIS — I2510 Atherosclerotic heart disease of native coronary artery without angina pectoris: Secondary | ICD-10-CM

## 2014-07-21 NOTE — Patient Instructions (Signed)
Your physician recommends that you continue on your current medications as directed. Please refer to the Current Medication list given to you today.   Your physician recommends that you schedule a follow-up appointment as needed  

## 2014-07-21 NOTE — Progress Notes (Signed)
Patient ID: [name], male   DOB: 16-Feb-1923, 80 y.o.   MRN: [id number]    [address] [place], [place]  [id number] Phone: [phone] Fax:  [phone]  Date:  07/21/2014   ID:  Maxwell Pierinillie T Hallam, DOB 16-Feb-1923, MRN [id number]  PCP:  Pearla DubonnetGATES,ROBERT NEVILL, MD   ASSESSMENT:  1. Coronary artery disease, stable without angina 2. Diastolic heart failure, stable 3. Hypertension, stable 4. Elderly and frail  PLAN:  1. Given stable cardiovascular status, no changes are made 2. We will change the patient to an as-needed basis. There is no need to have routine cardiology follow-up   SUBJECTIVE: Maxwell PieriniOllie T Howell is a 80 y.o. male having hallucinations from time to time. Wife is getting progressively weaker. Appetite is been stable. He is losing his energy. He denies orthopnea, angina, syncope, and palpitations.   Wt Readings from Last 3 Encounters:  07/21/14 155 lb (70.308 kg)  12/04/13 145 lb (65.772 kg)  07/08/13 148 lb (67.132 kg)     Past Medical History  Diagnosis Date  . CAD (coronary artery disease)      S/P stents RCA X 4 1999; EF 64%, no ischemia, Cardiolite 11/2009 - Dr. Verdis PrimeHenry Smith - Dr. Donata ClayVan Trigt  . HTN (hypertension)   . Peptic ulcer disease   . BPH (benign prostatic hyperplasia)   . Insomnia   . GERD (gastroesophageal reflux disease)   . CHF (congestive heart failure)   . Hyperlipidemia   . Hypothyroidism   . H/O hiatal hernia   . Osteoarthritis     GOUT  . DDD (degenerative disc disease), lumbar   . Exertional shortness of breath     "@ times" (09/25/2012)  . History of blood transfusion     "related to bleeding ulcers" (09/25/2012)  . Lower GI bleeding ?2009    "once; had it cauterized" (09/25/2012)  . Stroke ?2003    denies residual (09/25/2012)  . Chronic lower back pain   . Gout     "once in awhile" (09/25/2012)  . Chronic kidney disease (CKD), stage IV (severe)     Stage IV - Dr. Kathe MarinerJ. Patel, December, 2013 - May, 2014  . Carotid bruit    with only 20-30% blockage bilaterally on carotid Dopplers in 2012    Current Outpatient Prescriptions  Medication Sig Dispense Refill  . atenolol (TENORMIN) 50 MG tablet Take 25 mg by mouth at bedtime.     . clopidogrel (PLAVIX) 75 MG tablet Take 75 mg by mouth daily.    . colchicine 0.6 MG tablet Take 0.6 mg by mouth 2 (two) times daily as needed. For gout flare    . Cyanocobalamin (VITAMIN B-12 PO) Take 1 capsule by mouth at bedtime.    . furosemide (LASIX) 40 MG tablet Take 20-40 mg by mouth daily.     Marland Kitchen. levothyroxine (SYNTHROID, LEVOTHROID) 75 MCG tablet Take 75 mcg by mouth daily.     . nitroGLYCERIN (NITROSTAT) 0.4 MG SL tablet Place 0.4 mg under the tongue every 5 (five) minutes as needed. For chest pain    . pantoprazole (PROTONIX) 20 MG tablet Take 20 mg by mouth daily.    . risperiDONE (RISPERDAL) 0.5 MG tablet 1 mg.   3  . simvastatin (ZOCOR) 20 MG tablet   1  . Tamsulosin HCl (FLOMAX) 0.4 MG CAPS Take 0.4 mg by mouth daily.     Marland Kitchen. ULORIC 40 MG tablet   2   No current facility-administered medications  for this visit.    Allergies:    Allergies  Allergen Reactions  . Penicillins Hives  . Allopurinol Other (See Comments)    Unknown   . Benicar [Olmesartan Medoxomil] Other (See Comments)    unknown    Social History:  The patient  reports that he quit smoking about 60 years ago. His smoking use included Cigarettes. He has a .6 pack-year smoking history. He has never used smokeless tobacco. He reports that he drinks alcohol. He reports that he does not use illicit drugs.   ROS:  Please see the history of present illness.   No blood in the urine or stool   All other systems reviewed and negative.   OBJECTIVE: VS:  BP 110/70 mmHg  Pulse 61  Ht 5\' 7"  (1.702 m)  Wt 155 lb (70.308 kg)  BMI 24.27 kg/m2 Well nourished, well developed, in no acute distress, elderly and frail HEENT: normal Neck: JVD flat. Carotid bruit absent  Cardiac:  normal S1, S2; RRR; no murmur Lungs:   clear to auscultation bilaterally, no wheezing, rhonchi or rales Abd: soft, nontender, no hepatomegaly Ext: Edema none. Pulses 2+ and symmetric Skin: warm and dry Neuro:  CNs 2-12 intact, no focal abnormalities noted  EKG:  First AV block with left anterior hemiblock. LVH.       Signed, Darci NeedleHenry W. B. Smith III, MD 07/21/2014 2:55 PM

## 2014-08-02 ENCOUNTER — Emergency Department (HOSPITAL_BASED_OUTPATIENT_CLINIC_OR_DEPARTMENT_OTHER)
Admission: EM | Admit: 2014-08-02 | Discharge: 2014-08-02 | Disposition: A | Discharge status: Home / Self Care | Payer: Medicare Other | Attending: Emergency Medicine | Admitting: Emergency Medicine

## 2014-08-02 ENCOUNTER — Encounter (HOSPITAL_BASED_OUTPATIENT_CLINIC_OR_DEPARTMENT_OTHER): Payer: Self-pay | Admitting: *Deleted

## 2014-08-02 ENCOUNTER — Emergency Department (HOSPITAL_BASED_OUTPATIENT_CLINIC_OR_DEPARTMENT_OTHER): Payer: Medicare Other

## 2014-08-02 DIAGNOSIS — N184 Chronic kidney disease, stage 4 (severe): Secondary | ICD-10-CM | POA: Diagnosis not present

## 2014-08-02 DIAGNOSIS — R109 Unspecified abdominal pain: Secondary | ICD-10-CM | POA: Diagnosis present

## 2014-08-02 DIAGNOSIS — Z9861 Coronary angioplasty status: Secondary | ICD-10-CM | POA: Diagnosis not present

## 2014-08-02 DIAGNOSIS — E785 Hyperlipidemia, unspecified: Secondary | ICD-10-CM | POA: Diagnosis not present

## 2014-08-02 DIAGNOSIS — N400 Enlarged prostate without lower urinary tract symptoms: Secondary | ICD-10-CM | POA: Insufficient documentation

## 2014-08-02 DIAGNOSIS — K219 Gastro-esophageal reflux disease without esophagitis: Secondary | ICD-10-CM | POA: Diagnosis not present

## 2014-08-02 DIAGNOSIS — Z79899 Other long term (current) drug therapy: Secondary | ICD-10-CM | POA: Insufficient documentation

## 2014-08-02 DIAGNOSIS — I509 Heart failure, unspecified: Secondary | ICD-10-CM | POA: Insufficient documentation

## 2014-08-02 DIAGNOSIS — Z880 Allergy status to penicillin: Secondary | ICD-10-CM | POA: Diagnosis not present

## 2014-08-02 DIAGNOSIS — Z8673 Personal history of transient ischemic attack (TIA), and cerebral infarction without residual deficits: Secondary | ICD-10-CM | POA: Insufficient documentation

## 2014-08-02 DIAGNOSIS — M109 Gout, unspecified: Secondary | ICD-10-CM | POA: Insufficient documentation

## 2014-08-02 DIAGNOSIS — Z87891 Personal history of nicotine dependence: Secondary | ICD-10-CM | POA: Diagnosis not present

## 2014-08-02 DIAGNOSIS — Z8711 Personal history of peptic ulcer disease: Secondary | ICD-10-CM | POA: Insufficient documentation

## 2014-08-02 DIAGNOSIS — G8929 Other chronic pain: Secondary | ICD-10-CM | POA: Insufficient documentation

## 2014-08-02 DIAGNOSIS — E039 Hypothyroidism, unspecified: Secondary | ICD-10-CM | POA: Diagnosis not present

## 2014-08-02 DIAGNOSIS — I2510 Atherosclerotic heart disease of native coronary artery without angina pectoris: Secondary | ICD-10-CM | POA: Diagnosis not present

## 2014-08-02 DIAGNOSIS — I129 Hypertensive chronic kidney disease with stage 1 through stage 4 chronic kidney disease, or unspecified chronic kidney disease: Secondary | ICD-10-CM | POA: Diagnosis not present

## 2014-08-02 DIAGNOSIS — Z7902 Long term (current) use of antithrombotics/antiplatelets: Secondary | ICD-10-CM | POA: Diagnosis not present

## 2014-08-02 DIAGNOSIS — R1031 Right lower quadrant pain: Secondary | ICD-10-CM

## 2014-08-02 LAB — URINALYSIS, ROUTINE W REFLEX MICROSCOPIC
Bilirubin Urine: NEGATIVE
Glucose, UA: NEGATIVE mg/dL
HGB URINE DIPSTICK: NEGATIVE
KETONES UR: NEGATIVE mg/dL
Leukocytes, UA: NEGATIVE
Nitrite: NEGATIVE
PROTEIN: NEGATIVE mg/dL
Specific Gravity, Urine: 1.012 (ref 1.005–1.030)
Urobilinogen, UA: 0.2 mg/dL (ref 0.0–1.0)
pH: 5.5 (ref 5.0–8.0)

## 2014-08-02 LAB — CBC WITH DIFFERENTIAL/PLATELET
BASOS ABS: 0 10*3/uL (ref 0.0–0.1)
Basophils Relative: 1 % (ref 0–1)
EOS ABS: 0.1 10*3/uL (ref 0.0–0.7)
EOS PCT: 1 % (ref 0–5)
HCT: 27.7 % — ABNORMAL LOW (ref 39.0–52.0)
Hemoglobin: 8.6 g/dL — ABNORMAL LOW (ref 13.0–17.0)
LYMPHS PCT: 14 % (ref 12–46)
Lymphs Abs: 0.8 10*3/uL (ref 0.7–4.0)
MCH: 29.5 pg (ref 26.0–34.0)
MCHC: 31 g/dL (ref 30.0–36.0)
MCV: 94.9 fL (ref 78.0–100.0)
MONO ABS: 0.6 10*3/uL (ref 0.1–1.0)
Monocytes Relative: 11 % (ref 3–12)
Neutro Abs: 4.2 10*3/uL (ref 1.7–7.7)
Neutrophils Relative %: 73 % (ref 43–77)
Platelets: 178 10*3/uL (ref 150–400)
RBC: 2.92 MIL/uL — ABNORMAL LOW (ref 4.22–5.81)
RDW: 14.6 % (ref 11.5–15.5)
WBC: 5.7 10*3/uL (ref 4.0–10.5)

## 2014-08-02 LAB — COMPREHENSIVE METABOLIC PANEL
ALT: 19 U/L (ref 0–53)
AST: 17 U/L (ref 0–37)
Albumin: 3.4 g/dL — ABNORMAL LOW (ref 3.5–5.2)
Alkaline Phosphatase: 75 U/L (ref 39–117)
Anion gap: 14 (ref 5–15)
BUN: 58 mg/dL — ABNORMAL HIGH (ref 6–23)
CALCIUM: 9.1 mg/dL (ref 8.4–10.5)
CO2: 26 meq/L (ref 19–32)
CREATININE: 3.3 mg/dL — AB (ref 0.50–1.35)
Chloride: 101 mEq/L (ref 96–112)
GFR, EST AFRICAN AMERICAN: 18 mL/min — AB (ref >90)
GFR, EST NON AFRICAN AMERICAN: 15 mL/min — AB (ref >90)
Glucose, Bld: 111 mg/dL — ABNORMAL HIGH (ref 70–99)
Potassium: 4.3 mEq/L (ref 3.7–5.3)
Sodium: 141 mEq/L (ref 137–147)
Total Bilirubin: 0.3 mg/dL (ref 0.3–1.2)
Total Protein: 6.5 g/dL (ref 6.0–8.3)

## 2014-08-02 LAB — LIPASE, BLOOD: Lipase: 16 U/L (ref 11–59)

## 2014-08-02 LAB — I-STAT CG4 LACTIC ACID, ED: LACTIC ACID, VENOUS: 0.4 mmol/L — AB (ref 0.5–2.2)

## 2014-08-02 MED ORDER — SODIUM CHLORIDE 0.9 % IV SOLN
INTRAVENOUS | Status: DC
  Administered 2014-08-02: 11:00:00 via INTRAVENOUS

## 2014-08-02 MED ORDER — ONDANSETRON HCL 4 MG/2ML IJ SOLN
4.0000 mg | Freq: Once | INTRAMUSCULAR | Status: AC
  Administered 2014-08-02: 4 mg via INTRAVENOUS
  Filled 2014-08-02: qty 2

## 2014-08-02 MED ORDER — HYDROCODONE-ACETAMINOPHEN 5-325 MG PO TABS: 1.0000 | ORAL_TABLET | Freq: Four times a day (QID) | ORAL | Status: DC | PRN

## 2014-08-02 MED ORDER — DOCUSATE SODIUM 100 MG PO CAPS: 100.0000 mg | ORAL_CAPSULE | Freq: Two times a day (BID) | ORAL | Status: AC

## 2014-08-02 MED ORDER — IOHEXOL 300 MG/ML  SOLN
50.0000 mL | Freq: Once | INTRAMUSCULAR | Status: AC | PRN
  Administered 2014-08-02: 50 mL via ORAL

## 2014-08-02 MED ORDER — FENTANYL CITRATE 0.05 MG/ML IJ SOLN
50.0000 ug | Freq: Once | INTRAMUSCULAR | Status: AC
  Administered 2014-08-02: 50 ug via INTRAVENOUS
  Filled 2014-08-02: qty 2

## 2014-08-02 NOTE — Discharge Instructions (Signed)
Your labs, urine and CT scan today were normal. It is not clear what is causing your pain it is unlikely anything life-threatening and I recommend you follow-up with your primary care physician next week. If your pain worsens or you have fever, vomiting, black and tarry stools, blood in your stool or any symptoms concerning to you, please return to the emergency department.   Abdominal Pain Many things can cause abdominal pain. Usually, abdominal pain is not caused by a disease and will improve without treatment. It can often be observed and treated at home. Your health care provider will do a physical exam and possibly order blood tests and X-rays to help determine the seriousness of your pain. However, in many cases, more time must pass before a clear cause of the pain can be found. Before that point, your health care provider may not know if you need more testing or further treatment. HOME CARE INSTRUCTIONS  Monitor your abdominal pain for any changes. The following actions may help to alleviate any discomfort you are experiencing:  Only take over-the-counter or prescription medicines as directed by your health care provider.  Do not take laxatives unless directed to do so by your health care provider.  Try a clear liquid diet (broth, tea, or water) as directed by your health care provider. Slowly move to a bland diet as tolerated. SEEK MEDICAL CARE IF:  You have unexplained abdominal pain.  You have abdominal pain associated with nausea or diarrhea.  You have pain when you urinate or have a bowel movement.  You experience abdominal pain that wakes you in the night.  You have abdominal pain that is worsened or improved by eating food.  You have abdominal pain that is worsened with eating fatty foods.  You have a fever. SEEK IMMEDIATE MEDICAL CARE IF:   Your pain does not go away within 2 hours.  You keep throwing up (vomiting).  Your pain is felt only in portions of the abdomen,  such as the right side or the left lower portion of the abdomen.  You pass bloody or black tarry stools. MAKE SURE YOU:  Understand these instructions.   Will watch your condition.   Will get help right away if you are not doing well or get worse.  Document Released: 06/15/2005 Document Revised: 09/10/2013 Document Reviewed: 05/15/2013 [facility] Patient Information 2015 [place] VillageExitCare, [place]. This information is not intended to replace advice given to you by your health care provider. Make sure you discuss any questions you have with your health care provider.

## 2014-08-02 NOTE — ED Provider Notes (Signed)
TIME SEEN: 10:25 AM  CHIEF COMPLAINT: abdominal pain  HPI: Pt is a 80 y.o. male with history of coronary artery disease, hypertension, hyperlipidemia, hypothyroidism, chronic kidney disease who presents to the emergency department with complaints of right-sided abdominal pain for the past 3 days. Denies fevers, chills, nausea, vomiting, diarrhea, dysuria or hematuria, penile discharge. He reports that he noticed swelling in his right abdomen today. His wife who is currently receiving hospice treatment for cancer has a home health nurse that attends to her. The home health nurse recommended that the patient come to the emergency department today for evaluation. Denies any sick contacts or recent travel. Denies any bloody stools or melena.  ROS: See HPI Constitutional: no fever  Eyes: no drainage  ENT: no runny nose   Cardiovascular:  no chest pain  Resp: no SOB  GI: no vomiting GU: no dysuria Integumentary: no rash  Allergy: no hives  Musculoskeletal: no leg swelling  Neurological: no slurred speech ROS otherwise negative  PAST MEDICAL HISTORY/PAST SURGICAL HISTORY:  Past Medical History  Diagnosis Date  . CAD (coronary artery disease)      S/P stents RCA X 4 1999; EF 64%, no ischemia, Cardiolite 11/2009 - Dr. Verdis PrimeHenry Smith - Dr. Donata ClayVan Trigt  . HTN (hypertension)   . Peptic ulcer disease   . BPH (benign prostatic hyperplasia)   . Insomnia   . GERD (gastroesophageal reflux disease)   . CHF (congestive heart failure)   . Hyperlipidemia   . Hypothyroidism   . H/O hiatal hernia   . Osteoarthritis     GOUT  . DDD (degenerative disc disease), lumbar   . Exertional shortness of breath     "@ times" (09/25/2012)  . History of blood transfusion     "related to bleeding ulcers" (09/25/2012)  . Lower GI bleeding ?2009    "once; had it cauterized" (09/25/2012)  . Stroke ?2003    denies residual (09/25/2012)  . Chronic lower back pain   . Gout     "once in awhile" (09/25/2012)  . Chronic kidney  disease (CKD), stage IV (severe)     Stage IV - Dr. Kathe MarinerJ. Patel, December, 2013 - May, 2014  . Carotid bruit     with only 20-30% blockage bilaterally on carotid Dopplers in 2012    MEDICATIONS:  Prior to Admission medications   Medication Sig Start Date End Date Taking? Authorizing Provider  atenolol (TENORMIN) 50 MG tablet Take 25 mg by mouth at bedtime.     Historical Provider, MD  clopidogrel (PLAVIX) 75 MG tablet Take 75 mg by mouth daily.    Historical Provider, MD  colchicine 0.6 MG tablet Take 0.6 mg by mouth 2 (two) times daily as needed. For gout flare    Historical Provider, MD  Cyanocobalamin (VITAMIN B-12 PO) Take 1 capsule by mouth at bedtime.    Historical Provider, MD  furosemide (LASIX) 40 MG tablet Take 20-40 mg by mouth daily.     Historical Provider, MD  levothyroxine (SYNTHROID, LEVOTHROID) 75 MCG tablet Take 75 mcg by mouth daily.     Historical Provider, MD  nitroGLYCERIN (NITROSTAT) 0.4 MG SL tablet Place 0.4 mg under the tongue every 5 (five) minutes as needed. For chest pain    Historical Provider, MD  pantoprazole (PROTONIX) 20 MG tablet Take 20 mg by mouth daily.    Historical Provider, MD  risperiDONE (RISPERDAL) 0.5 MG tablet 1 mg.  07/11/14   Historical Provider, MD  simvastatin (ZOCOR) 20 MG tablet  05/21/14   Historical Provider, MD  Tamsulosin HCl (FLOMAX) 0.4 MG CAPS Take 0.4 mg by mouth daily.     Historical Provider, MD  ULORIC 40 MG tablet  07/19/14   Historical Provider, MD    ALLERGIES:  Allergies  Allergen Reactions  . Penicillins Hives  . Allopurinol Other (See Comments)    Unknown   . Benicar [Olmesartan Medoxomil] Other (See Comments)    unknown    SOCIAL HISTORY:  History  Substance Use Topics  . Smoking status: Former Smoker -- 0.12 packs/day for 5 years    Types: Cigarettes    Quit date: 07/20/1954  . Smokeless tobacco: Never Used  . Alcohol Use: Yes     Comment: 09/25/2012 "last alcohol was in 1949; have had a drink or 2"    FAMILY  HISTORY: No family history on file.  EXAM: BP 112/59 mmHg  Pulse 79  Temp(Src) 97.7 F (36.5 C) (Oral)  Resp 18  Ht 5\' 7"  (1.702 m)  Wt 150 lb (68.04 kg)  BMI 23.49 kg/m2  SpO2 93% CONSTITUTIONAL: Alert and oriented and responds appropriately to questions. Well-appearing; well-nourished HEAD: Normocephalic EYES: Conjunctivae clear, PERRL ENT: normal nose; no rhinorrhea; moist mucous membranes; pharynx without lesions noted NECK: Supple, no meningismus, no LAD  CARD: RRR; S1 and S2 appreciated; no murmurs, no clicks, no rubs, no gallops RESP: Normal chest excursion without splinting or tachypnea; breath sounds clear and equal bilaterally; no wheezes, no rhonchi, no rales,  ABD/GI: Normal bowel sounds; non-distended; soft, tender to palpation in the right mid abdomen and right lower quadrant with voluntary guarding, no rebound, no hernia appreciated, no masses, no peritoneal signs BACK:  The back appears normal and is non-tender to palpation, there is no CVA tenderness EXT: Normal ROM in all joints; non-tender to palpation; no edema; normal capillary refill; no cyanosis    SKIN: Normal color for age and race; warm NEURO: Moves all extremities equally PSYCH: The patient's mood and manner are appropriate. Grooming and personal hygiene are appropriate.  MEDICAL DECISION MAKING: Pt here with right lower abdominal pain with voluntary guarding.  He is status post hernia repair in this area and a prior surgery for a gastric ulcer. It is unclear if patient still has his appendix. Given he is significantly tender, will obtain a CT abdomen and pelvis for further evaluation. Differential diagnosis includes colitis, appendicitis, UTI, diverticulitis.  Will also obtain labs, urine. Will give IV fentanyl, Zofran, IV fluids.  ED PROGRESS: Pt's pain is controlled and he is tolerating by mouth. His labs are unremarkable other than very mild elevation of his creatinine from a baseline of 3 to 3.3. He has  received IV fluids. He does have Hemoglobin of 8.6 which is chronic for him, he has had a hemoglobin of 9.1 in 2013.  Unclear etiology for patient's pain but I do not feel there is anything life-threatening. He has a normal lactate. No leukocytosis. CT scan does not show any acute abdominal pathology. Appendix normal. No hernia. No bowel obstruction. I feel he is safe to be discharged home. We'll discharge with prescription for Vicodin and Colace. Will have to follow-up with his PCP. Discussed with patient that if symptoms worsen or he develops fever, vomiting, bloody stool or melena, he needs to return to the emergency department. Patient and sister at bedside verbalize understanding and are comfortable with plan.         Layla MawKristen N Ward, DO 08/02/14 1236

## 2014-08-02 NOTE — ED Notes (Signed)
Patient c/o R side abd pain for the past three days, no n/v/d, no fever

## 2014-08-19 DEATH — deceased

## 2014-09-15 ENCOUNTER — Emergency Department (HOSPITAL_BASED_OUTPATIENT_CLINIC_OR_DEPARTMENT_OTHER)
Admission: EM | Admit: 2014-09-15 | Discharge: 2014-09-15 | Disposition: A | Discharge status: Home / Self Care | Payer: Medicare Other | Attending: Emergency Medicine | Admitting: Emergency Medicine

## 2014-09-15 ENCOUNTER — Emergency Department (HOSPITAL_BASED_OUTPATIENT_CLINIC_OR_DEPARTMENT_OTHER): Payer: Medicare Other

## 2014-09-15 ENCOUNTER — Encounter (HOSPITAL_BASED_OUTPATIENT_CLINIC_OR_DEPARTMENT_OTHER): Payer: Self-pay

## 2014-09-15 DIAGNOSIS — I509 Heart failure, unspecified: Secondary | ICD-10-CM | POA: Diagnosis not present

## 2014-09-15 DIAGNOSIS — N184 Chronic kidney disease, stage 4 (severe): Secondary | ICD-10-CM | POA: Diagnosis not present

## 2014-09-15 DIAGNOSIS — I4891 Unspecified atrial fibrillation: Secondary | ICD-10-CM | POA: Diagnosis not present

## 2014-09-15 DIAGNOSIS — M109 Gout, unspecified: Secondary | ICD-10-CM | POA: Diagnosis not present

## 2014-09-15 DIAGNOSIS — Z9861 Coronary angioplasty status: Secondary | ICD-10-CM | POA: Diagnosis not present

## 2014-09-15 DIAGNOSIS — Z7902 Long term (current) use of antithrombotics/antiplatelets: Secondary | ICD-10-CM | POA: Insufficient documentation

## 2014-09-15 DIAGNOSIS — Z880 Allergy status to penicillin: Secondary | ICD-10-CM | POA: Insufficient documentation

## 2014-09-15 DIAGNOSIS — Z87891 Personal history of nicotine dependence: Secondary | ICD-10-CM | POA: Diagnosis not present

## 2014-09-15 DIAGNOSIS — N400 Enlarged prostate without lower urinary tract symptoms: Secondary | ICD-10-CM | POA: Insufficient documentation

## 2014-09-15 DIAGNOSIS — Z8673 Personal history of transient ischemic attack (TIA), and cerebral infarction without residual deficits: Secondary | ICD-10-CM | POA: Diagnosis not present

## 2014-09-15 DIAGNOSIS — R778 Other specified abnormalities of plasma proteins: Secondary | ICD-10-CM

## 2014-09-15 DIAGNOSIS — G8929 Other chronic pain: Secondary | ICD-10-CM | POA: Insufficient documentation

## 2014-09-15 DIAGNOSIS — K219 Gastro-esophageal reflux disease without esophagitis: Secondary | ICD-10-CM | POA: Diagnosis not present

## 2014-09-15 DIAGNOSIS — R0602 Shortness of breath: Secondary | ICD-10-CM | POA: Diagnosis present

## 2014-09-15 DIAGNOSIS — R739 Hyperglycemia, unspecified: Secondary | ICD-10-CM | POA: Diagnosis not present

## 2014-09-15 DIAGNOSIS — I129 Hypertensive chronic kidney disease with stage 1 through stage 4 chronic kidney disease, or unspecified chronic kidney disease: Secondary | ICD-10-CM | POA: Insufficient documentation

## 2014-09-15 DIAGNOSIS — R7989 Other specified abnormal findings of blood chemistry: Secondary | ICD-10-CM

## 2014-09-15 DIAGNOSIS — M1990 Unspecified osteoarthritis, unspecified site: Secondary | ICD-10-CM | POA: Diagnosis not present

## 2014-09-15 DIAGNOSIS — Z79899 Other long term (current) drug therapy: Secondary | ICD-10-CM | POA: Diagnosis not present

## 2014-09-15 DIAGNOSIS — E039 Hypothyroidism, unspecified: Secondary | ICD-10-CM | POA: Diagnosis not present

## 2014-09-15 DIAGNOSIS — I2510 Atherosclerotic heart disease of native coronary artery without angina pectoris: Secondary | ICD-10-CM | POA: Diagnosis not present

## 2014-09-15 DIAGNOSIS — Z8711 Personal history of peptic ulcer disease: Secondary | ICD-10-CM | POA: Diagnosis not present

## 2014-09-15 LAB — CBC
HCT: 30.9 % — ABNORMAL LOW (ref 39.0–52.0)
HEMOGLOBIN: 9.6 g/dL — AB (ref 13.0–17.0)
MCH: 29.5 pg (ref 26.0–34.0)
MCHC: 31.1 g/dL (ref 30.0–36.0)
MCV: 95.1 fL (ref 78.0–100.0)
Platelets: 219 10*3/uL (ref 150–400)
RBC: 3.25 MIL/uL — ABNORMAL LOW (ref 4.22–5.81)
RDW: 14.4 % (ref 11.5–15.5)
WBC: 8.9 10*3/uL (ref 4.0–10.5)

## 2014-09-15 LAB — URINALYSIS, ROUTINE W REFLEX MICROSCOPIC
BILIRUBIN URINE: NEGATIVE
Glucose, UA: NEGATIVE mg/dL
HGB URINE DIPSTICK: NEGATIVE
KETONES UR: NEGATIVE mg/dL
Leukocytes, UA: NEGATIVE
Nitrite: NEGATIVE
PH: 5 (ref 5.0–8.0)
PROTEIN: NEGATIVE mg/dL
Specific Gravity, Urine: 1.012 (ref 1.005–1.030)
Urobilinogen, UA: 0.2 mg/dL (ref 0.0–1.0)

## 2014-09-15 LAB — COMPREHENSIVE METABOLIC PANEL
ALBUMIN: 3.5 g/dL (ref 3.5–5.2)
ALK PHOS: 86 U/L (ref 39–117)
ALT: 21 U/L (ref 0–53)
AST: 37 U/L (ref 0–37)
Anion gap: 14 (ref 5–15)
BUN: 66 mg/dL — ABNORMAL HIGH (ref 6–23)
CO2: 24 mmol/L (ref 19–32)
Calcium: 8.7 mg/dL (ref 8.4–10.5)
Chloride: 98 mEq/L (ref 96–112)
Creatinine, Ser: 3.45 mg/dL — ABNORMAL HIGH (ref 0.50–1.35)
GFR calc Af Amer: 17 mL/min — ABNORMAL LOW (ref >90)
GFR calc non Af Amer: 14 mL/min — ABNORMAL LOW (ref >90)
Glucose, Bld: 290 mg/dL — ABNORMAL HIGH (ref 70–99)
POTASSIUM: 3.6 mmol/L (ref 3.5–5.1)
Sodium: 136 mmol/L (ref 135–145)
TOTAL PROTEIN: 7.4 g/dL (ref 6.0–8.3)
Total Bilirubin: 0.5 mg/dL (ref 0.3–1.2)

## 2014-09-15 LAB — TROPONIN I: Troponin I: 0.05 ng/mL — ABNORMAL HIGH (ref <0.031)

## 2014-09-15 NOTE — ED Notes (Signed)
Fluid in lungs, cough x 2 week ago-completed abx-today hospice nurse advised she could not hear any lungs sounds and pt is dehydrated-app info per daughter-in-law and son

## 2014-09-15 NOTE — Discharge Instructions (Signed)
As dicussed you need to follow up with to make sure that you are drinking water and that you are walking about the house. Return here if something changes Atrial Fibrillation Atrial fibrillation is a condition that causes your heart to beat irregularly. It may also cause your heart to beat faster than normal. Atrial fibrillation can prevent your heart from pumping blood normally. It increases your risk of stroke and heart problems. HOME CARE  Take medications as told by your doctor.  Only take medications that your doctor says are safe. Some medications can make the condition worse or happen again.  If blood thinners were prescribed by your doctor, take them exactly as told. Too much can cause bleeding. Too little and you will not have the needed protection against stroke and other problems.  Perform blood tests at home if told by your doctor.  Perform blood tests exactly as told by your doctor.  Do not drink alcohol.  Do not drink beverages with caffeine such as coffee, soda, and some teas.  Maintain a healthy weight.  Do not use diet pills unless your doctor says they are safe. They may make heart problems worse.  Follow diet instructions as told by your doctor.  Exercise regularly as told by your doctor.  Keep all follow-up appointments. GET HELP IF:  You notice a change in the speed, rhythm, or strength of your heartbeat.  You suddenly begin peeing (urinating) more often.  You get tired more easily when moving or exercising. GET HELP RIGHT AWAY IF:   You have chest or belly (abdominal) pain.  You feel sick to your stomach (nauseous).  You are short of breath.  You suddenly have swollen feet and ankles.  You feel dizzy.  You face, arms, or legs feel numb or weak.  There is a change in your vision or speech. MAKE SURE YOU:   Understand these instructions.  Will watch your condition.  Will get help right away if you are not doing well or get worse. Document  Released: 06/14/2008 Document Revised: 01/20/2014 Document Reviewed: 10/16/2012 [facility]ExitCare Patient Information 2015 [place]ExitCare, [place]. This information is not intended to replace advice given to you by your health care provider. Make sure you discuss any questions you have with your health care provider.  Chronic Kidney Disease Chronic kidney disease occurs when the kidneys are damaged over a long period. The kidneys are two organs that lie on either side of the spine between the middle of the back and the front of the abdomen. The kidneys:   Remove wastes and extra water from the blood.   Produce important hormones. These help keep bones strong, regulate blood pressure, and help create red blood cells.   Balance the fluids and chemicals in the blood and tissues. A small amount of kidney damage may not cause problems, but a large amount of damage may make it difficult or impossible for the kidneys to work the way they should. If steps are not taken to slow down the kidney damage or stop it from getting worse, the kidneys may stop working permanently. Most of the time, chronic kidney disease does not go away. However, it can often be controlled, and those with the disease can usually live normal lives. CAUSES  The most common causes of chronic kidney disease are diabetes and high blood pressure (hypertension). Chronic kidney disease may also be caused by:   Diseases that cause the kidneys' filters to become inflamed.   Diseases that affect the immune  system.   Genetic diseases.   Medicines that damage the kidneys, such as anti-inflammatory medicines.  Poisoning or exposure to toxic substances.   A reoccurring kidney or urinary infection.   A problem with urine flow. This may be caused by:   Cancer.   Kidney stones.   An enlarged prostate in males. SIGNS AND SYMPTOMS  Because the kidney damage in chronic kidney disease occurs slowly, symptoms develop slowly and may not be  obvious until the kidney damage becomes severe. A person may have a kidney disease for years without showing any symptoms. Symptoms can include:   Swelling (edema) of the legs, ankles, or feet.   Tiredness (lethargy).   Nausea or vomiting.   Confusion.   Problems with urination, such as:   Decreased urine production.   Frequent urination, especially at night.   Frequent accidents in children who are potty trained.   Muscle twitches and cramps.   Shortness of breath.  Weakness.   Persistent itchiness.   Loss of appetite.  Metallic taste in the mouth.  Trouble sleeping.  Slowed development in children.  Short stature in children. DIAGNOSIS  Chronic kidney disease may be detected and diagnosed by tests, including blood, urine, imaging, or kidney biopsy tests.  TREATMENT  Most chronic kidney diseases cannot be cured. Treatment usually involves relieving symptoms and preventing or slowing the progression of the disease. Treatment may include:   A special diet. You may need to avoid alcohol and foods thatare salty and high in potassium.   Medicines. These may:   Lower blood pressure.   Relieve anemia.   Relieve swelling.   Protect the bones. HOME CARE INSTRUCTIONS   Follow your prescribed diet.   Take medicines only as directed by your health care provider. Do not take any new medicines (prescription, over-the-counter, or nutritional supplements) unless approved by your health care provider. Many medicines can worsen your kidney damage or need to have the dose adjusted.   Quit smoking if you smoke. Talk to your health care provider about a smoking cessation program.   Keep all follow-up visits as directed by your health care provider. SEEK IMMEDIATE MEDICAL CARE IF:  Your symptoms get worse or you develop new symptoms.   You develop symptoms of end-stage kidney disease. These include:   Headaches.   Abnormally dark or light skin.    Numbness in the hands or feet.   Easy bruising.   Frequent hiccups.   Menstruation stops.   You have a fever.   You have decreased urine production.   You havepain or bleeding when urinating. MAKE SURE YOU:  Understand these instructions.  Will watch your condition.  Will get help right away if you are not doing well or get worse. FOR MORE INFORMATION   American Association of Kidney Patients: ResidentialShow.iswww.aakp.org  National Kidney Foundation: www.kidney.org  [employer] Kid[employer] Fund: FightingMatch.com.eewww.akfinc.org  [employer] [facility]: www.lifeoptions.org and www.kidneyschool.org Document Released: 06/14/2008 Document Revised: 01/20/2014 Document Reviewed: 05/04/2012 [facility]ExitCare Patient Information 2015 [place]Exit[place], [place]. This information is not intended to replace advice given to you by your health care provider. Make sure you discuss any questions you have with your health care provider.

## 2014-09-15 NOTE — ED Provider Notes (Signed)
CSN: [id number]     Arrival date & time 09/15/14  1313 History   First MD Initiated Contact with Patient 09/15/14 1330     Chief Complaint  Patient presents with  . Shortness of Breath  . Abdominal Pain     (Consider location/radiation/quality/duration/timing/severity/associated sxs/prior Treatment) HPI Comments: Pt comes in today after being told by the hospice nurse that she wasn't hearing lung sounds. Pt states that he is on hospice because of his renal disease. Pt is refusing to go to dialysis and is not seeing his renal doctor any more. Pt states that he has been having generalized myalgias over the last month that is not getting any better. Denies fever, nausea, vomiting or diarrhea. He state that he is sob but not more then his baseline. Pt lost his wife over a month ago and states that he can't wait to see his wife in [place] again. Denies cp. States that he always has abdominal pain now and nothing has been found. He states that he doesn't want to go to the hospital. Family states that they just want to know if he has an infection. Pt has not taken his atenolol in the last 3 days by accident  Patient is a 80 y.o. male presenting with abdominal pain. The history is provided by the patient. No language interpreter was used.  Abdominal Pain   Past Medical History  Diagnosis Date  . CAD (coronary artery disease)      S/P stents RCA X 4 1999; EF 64%, no ischemia, Cardiolite 11/2009 - Dr. Verdis Prime - Dr. Donata Clay  . HTN (hypertension)   . Peptic ulcer disease   . BPH (benign prostatic hyperplasia)   . Insomnia   . GERD (gastroesophageal reflux disease)   . CHF (congestive heart failure)   . Hyperlipidemia   . Hypothyroidism   . H/O hiatal hernia   . Osteoarthritis     GOUT  . DDD (degenerative disc disease), lumbar   . Exertional shortness of breath     "@ times" (09/25/2012)  . History of blood transfusion     "related to bleeding ulcers" (09/25/2012)  . Lower GI bleeding ?2009     "once; had it cauterized" (09/25/2012)  . Stroke ?2003    denies residual (09/25/2012)  . Chronic lower back pain   . Gout     "once in awhile" (09/25/2012)  . Chronic kidney disease (CKD), stage IV (severe)     Stage IV - Dr. Kathe Mariner, December, 2013 - May, 2014  . Carotid bruit     with only 20-30% blockage bilaterally on carotid Dopplers in 2012   Past Surgical History  Procedure Laterality Date  . Vagotomy and pyloroplasty  1970's?    "bleeding ulcers" (09/25/2012)  . S/p decompressive laminectomy,medial facetectomy and forminotomy l4-l5    . Great toe chilectomy left  ?1970's  . Hemoclipped gastric ulcer  2009  . Lumbar epidural steroid injection l2-3  2010  . Coronary angioplasty with stent placement  09/20/1997; ~ 21308    "2 + 2; total of 4" (09/25/2012)DR H SMITH   . Cataract extraction w/ intraocular lens implant  ~ 2009    RIGHT EYE  . Hernia repair  09/25/2012    VHR w/mesh (09/25/2012)  . Toe surgery      "cut one of my toes off and had it reattached; ? toe" (09/25/2012)  . Brow lift  2000's    "both" (09/25/2012)  . Dupuytren contracture release  right palm/notes (09/25/2012)  . Ventral hernia repair  09/25/2012    Procedure: LAPAROSCOPIC VENTRAL HERNIA;  Surgeon: Clovis Puhomas A. Cornett, MD;  Location: [facility] OR;  Service: General;  Laterality: N/A;  laparoscopic incisional hernia repair with mesh  . Insertion of mesh  09/25/2012    Procedure: INSERTION OF MESH;  Surgeon: Clovis Puhomas A. Cornett, MD;  Location: [facility] OR;  Service: General;  Laterality: N/A;   No family history on file. History  Substance Use Topics  . Smoking status: Former Smoker -- 0.12 packs/day for 5 years    Types: Cigarettes    Quit date: 07/20/1954  . Smokeless tobacco: Never Used  . Alcohol Use: No     Comment: 09/25/2012 "last alcohol was in 1949; have had a drink or 2"    Review of Systems  Gastrointestinal: Positive for abdominal pain.  All other systems reviewed and are negative.     Allergies   Penicillins; Allopurinol; and Benicar  Home Medications   Prior to Admission medications   Medication Sig Start Date End Date Taking? Authorizing Provider  atenolol (TENORMIN) 50 MG tablet Take 25 mg by mouth at bedtime.     Historical Provider, MD  clopidogrel (PLAVIX) 75 MG tablet Take 75 mg by mouth daily.    Historical Provider, MD  colchicine 0.6 MG tablet Take 0.6 mg by mouth 2 (two) times daily as needed. For gout flare    Historical Provider, MD  Cyanocobalamin (VITAMIN B-12 PO) Take 1 capsule by mouth at bedtime.    Historical Provider, MD  docusate sodium (COLACE) 100 MG capsule Take 1 capsule (100 mg total) by mouth every 12 (twelve) hours. 08/02/14   Kristen N Ward, DO  furosemide (LASIX) 40 MG tablet Take 20-40 mg by mouth daily.     Historical Provider, MD  levothyroxine (SYNTHROID, LEVOTHROID) 75 MCG tablet Take 75 mcg by mouth daily.     Historical Provider, MD  nitroGLYCERIN (NITROSTAT) 0.4 MG SL tablet Place 0.4 mg under the tongue every 5 (five) minutes as needed. For chest pain    Historical Provider, MD  pantoprazole (PROTONIX) 20 MG tablet Take 20 mg by mouth daily.    Historical Provider, MD  simvastatin (ZOCOR) 20 MG tablet  05/21/14   Historical Provider, MD  Tamsulosin HCl (FLOMAX) 0.4 MG CAPS Take 0.4 mg by mouth daily.     Historical Provider, MD  ULORIC 40 MG tablet  07/19/14   Historical Provider, MD   BP 115/66 mmHg  Pulse 89  Temp(Src) 98 F (36.7 C) (Oral)  Resp 18  Ht 5\' 7"  (1.702 m)  Wt 150 lb (68.04 kg)  BMI 23.49 kg/m2  SpO2 94% Physical Exam  Constitutional: He is oriented to person, place, and time. He appears well-developed and well-nourished.  HENT:  Head: Normocephalic and atraumatic.  Cardiovascular: Regular rhythm.   Pulmonary/Chest: Effort normal and breath sounds normal. He exhibits no tenderness.  Abdominal: Soft. Bowel sounds are normal. There is no tenderness.  Musculoskeletal: Normal range of motion. He exhibits no edema.   Neurological: He is alert and oriented to person, place, and time. He exhibits normal muscle tone. Coordination normal.  Skin: Skin is warm and dry.  Psychiatric: He has a normal mood and affect.  Nursing note and vitals reviewed.   ED Course  Procedures (including critical care time) Labs Review Labs Reviewed  CBC - Abnormal; Notable for the following:    RBC 3.25 (*)    Hemoglobin 9.6 (*)    HCT  30.9 (*)    All other components within normal limits  COMPREHENSIVE METABOLIC PANEL - Abnormal; Notable for the following:    Glucose, Bld 290 (*)    BUN 66 (*)    Creatinine, Ser 3.45 (*)    GFR calc non Af Amer 14 (*)    GFR calc Af Amer 17 (*)    All other components within normal limits  TROPONIN I - Abnormal; Notable for the following:    Troponin I 0.05 (*)    All other components within normal limits  URINALYSIS, ROUTINE W REFLEX MICROSCOPIC    Imaging Review Dg Chest 2 View  09/15/2014   CLINICAL DATA:  Chronic abdominal pain.  EXAM: CHEST  2 VIEW  COMPARISON:  None.  FINDINGS: There is bilateral diffuse interstitial thickening. There is no significant pleural effusion or pneumothorax. There is stable cardiomegaly. There is thoracic aortic atherosclerosis. There is enlargement of the central pulmonary vasculature. There is no acute osseus abnormality.  Multiple small bowel and colonic air-fluid levels as can be seen with enterocolitis.  IMPRESSION: 1. Bilateral diffuse interstitial thickening likely reflecting chronic interstitial disease. Mild superimposed interstitial edema or atypical infection cannot be excluded. 2. Multiple small bowel and colonic air-fluid levels as can be seen with enterocolitis.   Electronically Signed   By: Elige KoHetal  Patel   On: 09/15/2014 14:29     EKG Interpretation   Date/Time:  Monday September 15 2014 13:48:11 EST Ventricular Rate:  110 PR Interval:    QRS Duration: 120 QT Interval:  314 QTC Calculation: 424 R Axis:   -23 Text Interpretation:   Atrial fibrillation with rapid ventricular response  Left ventricular hypertrophy with QRS widening non-spec ST-T changes in V6  appear unchanged from July 2013 Inverted t waves in I and aVL Reconfirmed  by HARRISON  MD, FORREST ([id number]) on 09/15/2014 4:03:58 PM      MDM   Final diagnoses:  Atrial fibrillation, unspecified  Hyperglycemia  Chronic renal failure, stage 4 (severe)  Elevated troponin    Discussed findings with pt an family. Pt doesn't want to be hospitalized for the findings. Trop likely related to kidney disease and chronic chf. Noted sugar likely related to hydration and kidney disease. Discussed with pt the importance of taking his atenolol. Pt has no definite infection or acute finding in lung and urine. Discussed with pt that he should hydrate at home. Discussed with family that unable to give time frame on effect of not treating kidney failure. Pt and family verbalized understanding of findings today, pt states that he doesn't want to go to the hospital and he understands the outcome if he chooses not to go    Teressa LowerVrinda Orvil Faraone, NP 09/15/14 1714  Purvis SheffieldForrest Harrison, MD 09/17/14 2119

## 2014-09-15 NOTE — ED Notes (Signed)
np at bedside

## 2014-10-20 DEATH — deceased

## 2015-03-16 ENCOUNTER — Other Ambulatory Visit: Payer: Self-pay
